# Patient Record
Sex: Female | Born: 1960 | Race: White | Hispanic: No | Smoking: Never smoker
Health system: Southern US, Community
[De-identification: ages and names within clinical notes are randomized; demographics above are authoritative.]

## PROBLEM LIST (undated history)

## (undated) DIAGNOSIS — R519 Headache, unspecified: Secondary | ICD-10-CM

## (undated) DIAGNOSIS — Z8719 Personal history of other diseases of the digestive system: Secondary | ICD-10-CM

## (undated) DIAGNOSIS — F419 Anxiety disorder, unspecified: Secondary | ICD-10-CM

## (undated) DIAGNOSIS — M545 Low back pain, unspecified: Secondary | ICD-10-CM

## (undated) DIAGNOSIS — I10 Essential (primary) hypertension: Secondary | ICD-10-CM

## (undated) DIAGNOSIS — E785 Hyperlipidemia, unspecified: Secondary | ICD-10-CM

## (undated) DIAGNOSIS — R51 Headache: Secondary | ICD-10-CM

## (undated) DIAGNOSIS — Z9889 Other specified postprocedural states: Secondary | ICD-10-CM

## (undated) DIAGNOSIS — M1990 Unspecified osteoarthritis, unspecified site: Secondary | ICD-10-CM

## (undated) DIAGNOSIS — M25551 Pain in right hip: Secondary | ICD-10-CM

## (undated) DIAGNOSIS — R011 Cardiac murmur, unspecified: Secondary | ICD-10-CM

## (undated) HISTORY — DX: Low back pain, unspecified: M54.50

## (undated) HISTORY — DX: Pain in right hip: M25.551

## (undated) HISTORY — DX: Hyperlipidemia, unspecified: E78.5

## (undated) HISTORY — DX: Essential (primary) hypertension: I10

## (undated) HISTORY — DX: Unspecified osteoarthritis, unspecified site: M19.90

## (undated) HISTORY — DX: Personal history of other diseases of the digestive system: Z87.19

## (undated) HISTORY — DX: Low back pain: M54.5

## (undated) HISTORY — DX: Anxiety disorder, unspecified: F41.9

## (undated) HISTORY — PX: PAIN PUMP IMPLANTATION: SHX330

## (undated) HISTORY — DX: Other specified postprocedural states: Z98.890

## (undated) HISTORY — PX: CYSTECTOMY: SUR359

## (undated) HISTORY — PX: HAND SURGERY: SHX662

## (undated) HISTORY — PX: HEMORRHOID SURGERY: SHX153

---

## 2008-10-12 ENCOUNTER — Emergency Department: Payer: Self-pay | Admitting: Unknown Physician Specialty

## 2008-10-13 ENCOUNTER — Ambulatory Visit: Payer: Self-pay | Admitting: Cardiology

## 2008-12-17 ENCOUNTER — Ambulatory Visit: Payer: Self-pay | Admitting: Pain Medicine

## 2008-12-20 ENCOUNTER — Ambulatory Visit: Payer: Self-pay | Admitting: Pain Medicine

## 2009-01-03 ENCOUNTER — Ambulatory Visit: Payer: Self-pay | Admitting: Physician Assistant

## 2009-10-12 DIAGNOSIS — Z8711 Personal history of peptic ulcer disease: Secondary | ICD-10-CM

## 2009-10-12 HISTORY — DX: Personal history of peptic ulcer disease: Z87.11

## 2010-03-14 ENCOUNTER — Emergency Department (HOSPITAL_COMMUNITY): Admission: EM | Admit: 2010-03-14 | Discharge: 2010-03-15 | Payer: Self-pay | Admitting: Emergency Medicine

## 2010-04-05 ENCOUNTER — Inpatient Hospital Stay (HOSPITAL_COMMUNITY): Admission: EM | Admit: 2010-04-05 | Discharge: 2010-04-09 | Payer: Self-pay | Admitting: Cardiovascular Disease

## 2010-04-05 ENCOUNTER — Encounter (INDEPENDENT_AMBULATORY_CARE_PROVIDER_SITE_OTHER): Payer: Self-pay | Admitting: Cardiovascular Disease

## 2010-04-06 ENCOUNTER — Encounter: Payer: Self-pay | Admitting: Cardiology

## 2010-04-06 LAB — CONVERTED CEMR LAB
Cholesterol: 135 mg/dL
HDL: 56 mg/dL

## 2010-04-07 DIAGNOSIS — Z9889 Other specified postprocedural states: Secondary | ICD-10-CM

## 2010-04-07 HISTORY — DX: Other specified postprocedural states: Z98.890

## 2010-04-08 ENCOUNTER — Encounter: Payer: Self-pay | Admitting: Cardiology

## 2010-04-08 ENCOUNTER — Encounter (INDEPENDENT_AMBULATORY_CARE_PROVIDER_SITE_OTHER): Payer: Self-pay | Admitting: *Deleted

## 2010-04-08 DIAGNOSIS — I209 Angina pectoris, unspecified: Secondary | ICD-10-CM

## 2010-04-08 DIAGNOSIS — R55 Syncope and collapse: Secondary | ICD-10-CM | POA: Insufficient documentation

## 2010-04-08 LAB — CONVERTED CEMR LAB
BUN: 8 mg/dL
Chloride: 110 meq/L
Glucose, Bld: 85 mg/dL
Platelets: 141 10*3/uL
Sodium: 143 meq/L

## 2010-04-09 ENCOUNTER — Encounter (INDEPENDENT_AMBULATORY_CARE_PROVIDER_SITE_OTHER): Payer: Self-pay | Admitting: *Deleted

## 2010-10-08 ENCOUNTER — Observation Stay (HOSPITAL_COMMUNITY): Admission: EM | Admit: 2010-10-08 | Discharge: 2010-10-11 | Payer: Self-pay | Source: Home / Self Care

## 2010-11-11 NOTE — Miscellaneous (Signed)
Summary: hospital labs 6/28/201  Clinical Lists Changes  Observations: Added new observation of CALCIUM: 8.5 mg/dL (16/07/9603 54:09) Added new observation of GFR AA: >60 mL/min/1.66m2 (04/08/2010 14:31) Added new observation of GFR: >60 mL/min (04/08/2010 14:31) Added new observation of CREATININE: 0.63 mg/dL (81/19/1478 29:56) Added new observation of BUN: 8 mg/dL (21/30/8657 84:69) Added new observation of BG RANDOM: 85 mg/dL (62/95/2841 32:44) Added new observation of CO2 PLSM/SER: 24 meq/L (04/08/2010 14:31) Added new observation of CL SERUM: 110 meq/L (04/08/2010 14:31) Added new observation of K SERUM: 3.6 meq/L (04/08/2010 14:31) Added new observation of NA: 143 meq/L (04/08/2010 14:31) Added new observation of PLATELETK/UL: 141 K/uL (04/08/2010 14:31) Added new observation of MCV: 96.4 fL (04/08/2010 14:31) Added new observation of HCT: 33.6 % (04/08/2010 14:31) Added new observation of HGB: 11.6 g/dL (10/14/7251 66:44) Added new observation of WBC COUNT: 4.5 10*3/microliter (04/08/2010 14:31)

## 2010-11-11 NOTE — Letter (Signed)
Summary: Appointment - Missed  [facility] [place]  [address] [place] [address]   Phone: [phone]  Fax: [phone]     April 09, 2010 MRN: [id number]   Audrey Bird [address] [place], [place]  [address]   Dear [name],  Our records indicate you missed your appointment on      04/09/10                  with Dr.       .        ROTHBART                            It is very important that we reach you to reschedule this appointment. We look forward to participating in your health care needs. Please contact us at the number listed above at your earliest convenience to reschedule this appointment.     Sincerely,    Glass blower/designer

## 2010-12-22 LAB — BASIC METABOLIC PANEL
BUN: 8 mg/dL (ref 6–23)
CO2: 23 mEq/L (ref 19–32)
CO2: 26 mEq/L (ref 19–32)
Calcium: 8.4 mg/dL (ref 8.4–10.5)
Chloride: 106 mEq/L (ref 96–112)
Chloride: 110 mEq/L (ref 96–112)
Creatinine, Ser: 0.73 mg/dL (ref 0.4–1.2)
GFR calc Af Amer: >60 mL/min (ref >60)
GFR calc non Af Amer: >60 mL/min (ref >60)
Glucose, Bld: 85 mg/dL (ref 70–99)

## 2010-12-22 LAB — CBC
HCT: 28.9 % — ABNORMAL LOW (ref 36.0–46.0)
Hemoglobin: 11.2 g/dL — ABNORMAL LOW (ref 12.0–15.0)
Hemoglobin: 9.9 g/dL — ABNORMAL LOW (ref 12.0–15.0)
MCH: 29.7 pg (ref 26.0–34.0)
MCHC: 34.3 g/dL (ref 30.0–36.0)
Platelets: 206 10*3/uL (ref 150–400)
RBC: 3.33 MIL/uL — ABNORMAL LOW (ref 3.87–5.11)
RBC: 3.83 MIL/uL — ABNORMAL LOW (ref 3.87–5.11)

## 2010-12-22 LAB — FOLATE RBC: RBC Folate: 603 ng/mL — ABNORMAL HIGH (ref 180–600)

## 2010-12-22 LAB — HEMOGLOBIN A1C: Mean Plasma Glucose: 71 mg/dL (ref <117)

## 2010-12-22 LAB — COMPREHENSIVE METABOLIC PANEL
ALT: 8 U/L (ref 0–35)
Alkaline Phosphatase: 42 U/L (ref 39–117)
CO2: 26 mEq/L (ref 19–32)
Calcium: 8.7 mg/dL (ref 8.4–10.5)
GFR calc non Af Amer: >60 mL/min (ref >60)
Glucose, Bld: 82 mg/dL (ref 70–99)
Sodium: 144 mEq/L (ref 135–145)
Total Bilirubin: 0.2 mg/dL — ABNORMAL LOW (ref 0.3–1.2)

## 2010-12-22 LAB — IRON AND TIBC
Iron: 20 ug/dL — ABNORMAL LOW (ref 42–135)
UIBC: 226 ug/dL

## 2010-12-22 LAB — DIFFERENTIAL
Basophils Absolute: 0 10*3/uL (ref 0.0–0.1)
Basophils Relative: 0 % (ref 0–1)
Eosinophils Absolute: 0.1 10*3/uL (ref 0.0–0.7)
Lymphocytes Relative: 30 % (ref 12–46)
Lymphs Abs: 1.1 10*3/uL (ref 0.7–4.0)
Lymphs Abs: 1.6 10*3/uL (ref 0.7–4.0)
Monocytes Absolute: 0.3 10*3/uL (ref 0.1–1.0)
Monocytes Absolute: 0.3 10*3/uL (ref 0.1–1.0)
Monocytes Relative: 6 % (ref 3–12)
Monocytes Relative: 9 % (ref 3–12)
Neutro Abs: 1.5 10*3/uL — ABNORMAL LOW (ref 1.7–7.7)

## 2010-12-22 LAB — RAPID URINE DRUG SCREEN, HOSP PERFORMED
Amphetamines: NOT DETECTED
Benzodiazepines: NOT DETECTED
Tetrahydrocannabinol: NOT DETECTED

## 2010-12-22 LAB — H. PYLORI ANTIBODY, IGG: H Pylori IgG: <0.4 {ISR}

## 2010-12-22 LAB — CARDIAC PANEL(CRET KIN+CKTOT+MB+TROPI)
Relative Index: INVALID (ref 0.0–2.5)
Troponin I: 0.01 ng/mL (ref 0.00–0.06)
Troponin I: <0.01 ng/mL (ref 0.00–0.06)
Troponin I: <0.01 ng/mL (ref 0.00–0.06)

## 2010-12-22 LAB — POCT CARDIAC MARKERS: Troponin i, poc: <0.05 ng/mL (ref 0.00–0.09)

## 2010-12-22 LAB — MAGNESIUM: Magnesium: 1.9 mg/dL (ref 1.5–2.5)

## 2010-12-22 LAB — TSH: TSH: 4.233 u[IU]/mL (ref 0.350–4.500)

## 2010-12-28 LAB — BASIC METABOLIC PANEL
BUN: 8 mg/dL (ref 6–23)
BUN: 9 mg/dL (ref 6–23)
CO2: 22 mEq/L (ref 19–32)
CO2: 25 mEq/L (ref 19–32)
CO2: 27 mEq/L (ref 19–32)
Calcium: 8.5 mg/dL (ref 8.4–10.5)
Calcium: 8.6 mg/dL (ref 8.4–10.5)
Calcium: 8.6 mg/dL (ref 8.4–10.5)
Calcium: 9.6 mg/dL (ref 8.4–10.5)
Chloride: 107 mEq/L (ref 96–112)
Chloride: 109 mEq/L (ref 96–112)
Chloride: 110 mEq/L (ref 96–112)
Chloride: 115 mEq/L — ABNORMAL HIGH (ref 96–112)
Creatinine, Ser: 0.67 mg/dL (ref 0.4–1.2)
Creatinine, Ser: 0.67 mg/dL (ref 0.4–1.2)
Creatinine, Ser: 0.68 mg/dL (ref 0.4–1.2)
GFR calc Af Amer: >60 mL/min (ref >60)
GFR calc Af Amer: >60 mL/min (ref >60)
GFR calc Af Amer: >60 mL/min (ref >60)
GFR calc Af Amer: >60 mL/min (ref >60)
GFR calc Af Amer: >60 mL/min (ref >60)
GFR calc Af Amer: >60 mL/min (ref >60)
GFR calc non Af Amer: >60 mL/min (ref >60)
GFR calc non Af Amer: >60 mL/min (ref >60)
GFR calc non Af Amer: >60 mL/min (ref >60)
Glucose, Bld: 85 mg/dL (ref 70–99)
Glucose, Bld: 90 mg/dL (ref 70–99)
Glucose, Bld: 96 mg/dL (ref 70–99)
Potassium: 3.2 mEq/L — ABNORMAL LOW (ref 3.5–5.1)
Potassium: 3.4 mEq/L — ABNORMAL LOW (ref 3.5–5.1)
Potassium: 3.5 mEq/L (ref 3.5–5.1)
Potassium: 3.6 mEq/L (ref 3.5–5.1)
Sodium: 138 mEq/L (ref 135–145)
Sodium: 141 mEq/L (ref 135–145)
Sodium: 142 mEq/L (ref 135–145)
Sodium: 143 mEq/L (ref 135–145)

## 2010-12-28 LAB — TSH: TSH: 2.127 u[IU]/mL (ref 0.350–4.500)

## 2010-12-28 LAB — CARDIAC PANEL(CRET KIN+CKTOT+MB+TROPI)
CK, MB: 0.8 ng/mL (ref 0.3–4.0)
CK, MB: 0.9 ng/mL (ref 0.3–4.0)
Relative Index: INVALID (ref 0.0–2.5)
Relative Index: INVALID (ref 0.0–2.5)
Total CK: 51 U/L (ref 7–177)
Troponin I: 0.01 ng/mL (ref 0.00–0.06)
Troponin I: 0.01 ng/mL (ref 0.00–0.06)
Troponin I: 0.01 ng/mL (ref 0.00–0.06)

## 2010-12-28 LAB — CBC
HCT: 33.6 % — ABNORMAL LOW (ref 36.0–46.0)
HCT: 35.5 % — ABNORMAL LOW (ref 36.0–46.0)
HCT: 40.1 % (ref 36.0–46.0)
Hemoglobin: 11.6 g/dL — ABNORMAL LOW (ref 12.0–15.0)
Hemoglobin: 12.5 g/dL (ref 12.0–15.0)
Hemoglobin: 14 g/dL (ref 12.0–15.0)
MCH: 33 pg (ref 26.0–34.0)
MCH: 33.3 pg (ref 26.0–34.0)
MCHC: 34.3 g/dL (ref 30.0–36.0)
MCHC: 34.8 g/dL (ref 30.0–36.0)
MCHC: 35.2 g/dL (ref 30.0–36.0)
MCV: 94.7 fL (ref 78.0–100.0)
Platelets: 149 10*3/uL — ABNORMAL LOW (ref 150–400)
Platelets: 157 10*3/uL (ref 150–400)
RBC: 3.49 MIL/uL — ABNORMAL LOW (ref 3.87–5.11)
RBC: 3.5 MIL/uL — ABNORMAL LOW (ref 3.87–5.11)
RBC: 3.6 MIL/uL — ABNORMAL LOW (ref 3.87–5.11)
RBC: 4.24 MIL/uL (ref 3.87–5.11)
RDW: 12.3 % (ref 11.5–15.5)
RDW: 12.4 % (ref 11.5–15.5)
RDW: 12.6 % (ref 11.5–15.5)
WBC: 6.1 10*3/uL (ref 4.0–10.5)
WBC: 6.9 10*3/uL (ref 4.0–10.5)

## 2010-12-28 LAB — DIFFERENTIAL
Basophils Relative: 1 % (ref 0–1)
Eosinophils Absolute: 0 10*3/uL (ref 0.0–0.7)
Eosinophils Relative: 1 % (ref 0–5)
Lymphs Abs: 2.2 10*3/uL (ref 0.7–4.0)
Monocytes Absolute: 0.3 10*3/uL (ref 0.1–1.0)
Monocytes Relative: 5 % (ref 3–12)
Neutrophils Relative %: 57 % (ref 43–77)

## 2010-12-28 LAB — SAMPLE TO BLOOD BANK

## 2010-12-28 LAB — HEPARIN LEVEL (UNFRACTIONATED)
Heparin Unfractionated: 0.41 IU/mL (ref 0.30–0.70)
Heparin Unfractionated: 0.64 IU/mL (ref 0.30–0.70)

## 2010-12-28 LAB — LIPID PANEL
Triglycerides: 51 mg/dL (ref <150)
VLDL: 10 mg/dL (ref 0–40)

## 2010-12-28 LAB — POCT I-STAT, CHEM 8
BUN: 11 mg/dL (ref 6–23)
Chloride: 109 mEq/L (ref 96–112)
Creatinine, Ser: 0.8 mg/dL (ref 0.4–1.2)
Sodium: 144 mEq/L (ref 135–145)
TCO2: 26 mmol/L (ref 0–100)

## 2010-12-28 LAB — POCT CARDIAC MARKERS
CKMB, poc: <1 ng/mL — ABNORMAL LOW (ref 1.0–8.0)
CKMB, poc: <1 ng/mL — ABNORMAL LOW (ref 1.0–8.0)
Myoglobin, poc: 32.1 ng/mL (ref 12–200)
Myoglobin, poc: 39.5 ng/mL (ref 12–200)
Troponin i, poc: <0.05 ng/mL (ref 0.00–0.09)

## 2010-12-28 LAB — PROTIME-INR: Prothrombin Time: 12.9 seconds (ref 11.6–15.2)

## 2010-12-29 LAB — CBC
HCT: 36.2 % (ref 36.0–46.0)
Hemoglobin: 12.6 g/dL (ref 12.0–15.0)
MCHC: 34.8 g/dL (ref 30.0–36.0)
MCV: 94.3 fL (ref 78.0–100.0)
RDW: 12.7 % (ref 11.5–15.5)

## 2010-12-29 LAB — DIFFERENTIAL
Basophils Absolute: 0 10*3/uL (ref 0.0–0.1)
Eosinophils Absolute: 0.1 10*3/uL (ref 0.0–0.7)
Eosinophils Relative: 1 % (ref 0–5)
Lymphocytes Relative: 42 % (ref 12–46)
Monocytes Absolute: 0.4 10*3/uL (ref 0.1–1.0)

## 2010-12-29 LAB — BASIC METABOLIC PANEL
BUN: 18 mg/dL (ref 6–23)
CO2: 25 mEq/L (ref 19–32)
Chloride: 110 mEq/L (ref 96–112)
GFR calc non Af Amer: >60 mL/min (ref >60)
Glucose, Bld: 95 mg/dL (ref 70–99)
Potassium: 3.1 mEq/L — ABNORMAL LOW (ref 3.5–5.1)
Sodium: 140 mEq/L (ref 135–145)

## 2010-12-29 LAB — POCT CARDIAC MARKERS
CKMB, poc: <1 ng/mL — ABNORMAL LOW (ref 1.0–8.0)
Troponin i, poc: <0.05 ng/mL (ref 0.00–0.09)
Troponin i, poc: <0.05 ng/mL (ref 0.00–0.09)

## 2011-01-28 ENCOUNTER — Emergency Department (HOSPITAL_COMMUNITY): Payer: Medicare Other

## 2011-01-28 ENCOUNTER — Observation Stay (HOSPITAL_COMMUNITY)
Admission: EM | Admit: 2011-01-28 | Discharge: 2011-01-29 | Disposition: A | Discharge status: Home / Self Care | Payer: Medicare Other | Attending: Cardiovascular Disease | Admitting: Cardiovascular Disease

## 2011-01-28 DIAGNOSIS — R51 Headache: Secondary | ICD-10-CM | POA: Insufficient documentation

## 2011-01-28 DIAGNOSIS — M25559 Pain in unspecified hip: Secondary | ICD-10-CM | POA: Insufficient documentation

## 2011-01-28 DIAGNOSIS — F411 Generalized anxiety disorder: Secondary | ICD-10-CM | POA: Insufficient documentation

## 2011-01-28 DIAGNOSIS — F17200 Nicotine dependence, unspecified, uncomplicated: Secondary | ICD-10-CM | POA: Insufficient documentation

## 2011-01-28 DIAGNOSIS — R079 Chest pain, unspecified: Principal | ICD-10-CM | POA: Insufficient documentation

## 2011-01-28 DIAGNOSIS — K208 Other esophagitis without bleeding: Secondary | ICD-10-CM | POA: Insufficient documentation

## 2011-01-28 LAB — BASIC METABOLIC PANEL
GFR calc non Af Amer: >60 mL/min (ref >60)
Potassium: 3.3 mEq/L — ABNORMAL LOW (ref 3.5–5.1)
Sodium: 136 mEq/L (ref 135–145)

## 2011-01-28 LAB — DIFFERENTIAL
Basophils Absolute: 0 10*3/uL (ref 0.0–0.1)
Eosinophils Absolute: 0 10*3/uL (ref 0.0–0.7)
Eosinophils Relative: 0 % (ref 0–5)
Lymphocytes Relative: 29 % (ref 12–46)
Neutrophils Relative %: 65 % (ref 43–77)

## 2011-01-28 LAB — CK TOTAL AND CKMB (NOT AT ARMC)
CK, MB: 1.3 ng/mL (ref 0.3–4.0)
Relative Index: INVALID (ref 0.0–2.5)
Total CK: 47 U/L (ref 7–177)

## 2011-01-28 LAB — CBC
Platelets: 175 10*3/uL (ref 150–400)
RDW: 14.7 % (ref 11.5–15.5)
WBC: 6.3 10*3/uL (ref 4.0–10.5)

## 2011-01-28 LAB — TROPONIN I: Troponin I: 0.02 ng/mL (ref 0.00–0.06)

## 2011-01-29 ENCOUNTER — Inpatient Hospital Stay (HOSPITAL_COMMUNITY): Payer: Medicare Other

## 2011-01-29 ENCOUNTER — Other Ambulatory Visit (HOSPITAL_COMMUNITY): Payer: Medicare Other

## 2011-01-29 LAB — CBC
Hemoglobin: 12.9 g/dL (ref 12.0–15.0)
Platelets: 160 10*3/uL (ref 150–400)
RBC: 4.43 MIL/uL (ref 3.87–5.11)
WBC: 3.6 10*3/uL — ABNORMAL LOW (ref 4.0–10.5)

## 2011-01-29 LAB — CARDIAC PANEL(CRET KIN+CKTOT+MB+TROPI)
Relative Index: INVALID (ref 0.0–2.5)
Troponin I: 0.01 ng/mL (ref 0.00–0.06)

## 2011-01-29 LAB — LIPID PANEL
Cholesterol: 155 mg/dL (ref 0–200)
Total CHOL/HDL Ratio: 2.8 RATIO
VLDL: 12 mg/dL (ref 0–40)

## 2011-01-29 LAB — COMPREHENSIVE METABOLIC PANEL
Alkaline Phosphatase: 30 U/L — ABNORMAL LOW (ref 39–117)
BUN: 10 mg/dL (ref 6–23)
CO2: 26 mEq/L (ref 19–32)
GFR calc non Af Amer: >60 mL/min (ref >60)
Glucose, Bld: 88 mg/dL (ref 70–99)
Potassium: 3.6 mEq/L (ref 3.5–5.1)
Total Bilirubin: 0.4 mg/dL (ref 0.3–1.2)
Total Protein: 5.7 g/dL — ABNORMAL LOW (ref 6.0–8.3)

## 2011-01-29 LAB — TSH: TSH: 3.285 u[IU]/mL (ref 0.350–4.500)

## 2011-01-29 LAB — HEPARIN LEVEL (UNFRACTIONATED): Heparin Unfractionated: 0.33 IU/mL (ref 0.30–0.70)

## 2011-01-29 MED ORDER — TECHNETIUM TC 99M TETROFOSMIN IV KIT
10.0000 | PACK | Freq: Once | INTRAVENOUS | Status: AC | PRN
  Administered 2011-01-29: 10 via INTRAVENOUS

## 2011-01-30 ENCOUNTER — Other Ambulatory Visit (HOSPITAL_COMMUNITY): Payer: Medicare Other

## 2011-02-03 NOTE — Discharge Summary (Signed)
Audrey Bird, Audrey Bird               ACCOUNT NO.:  [id number]  MEDICAL RECORD NO.:  [id number]           PATIENT TYPE:  I  LOCATION:  2027                         FACILITY:  [facility]  PHYSICIAN:  Ricki Rodriguez, M.D.  DATE OF BIRTH:  1960-11-07  DATE OF ADMISSION:  01/28/2011 DATE OF DISCHARGE:  01/29/2011                              DISCHARGE SUMMARY   FINAL DIAGNOSES: 1. Chest pain. 2. Right hip pain. 3. Anxiety. 4. Reflux esophagitis. 5. Chronic headache.  DISCHARGE MEDICATIONS: 1. Aspirin 81 mg one daily. 2. Lisinopril 2.5 mg daily. 3. Phenergan 12.5 mg twice daily as needed for nausea. 4. Potassium 10 mEq one daily. 5. Simvastatin 10 mg one at bedtime. 6. Amlodipine 2.5 mg one daily. 7. Fish oil 1 g daily. 8. Hydrocodone/APAP 5/500 mg one four times daily as needed. 9. Iron over-the-counter one daily. 10.Multivitamin daily. 11.Nitroglycerin sublingual 0.4 mg tablet one under the tongue every 5     minutes x3 as needed for chest pain. 12.Protonix 40 mg enteric-coated one daily.  DISCHARGE DIET:  Low-sodium heart-healthy diet.  DISCHARGE ACTIVITY:  The patient to increase activity slowly as tolerated.  FOLLOWUP:  By Dr. Orpah Cobb in 1 month and by primary care physician Dr. Modesto Charon roughly in the [place] area in 1 week.  HISTORY:  This 50 year old white female with a known history of coronary artery disease and coronary vasospasm, had recurrent substernal chest pain according to the patient and husband.  The patient gets this pain on leaving her right hip pain becomes unbearable and she gets into nausea, vomiting and then comes down with chest pain.  PHYSICAL EXAMINATION:  VITAL SIGNS:  Temperature 98, pulse 70, respirations 16 and blood pressure 110/60. GENERAL:  The patient is averagely built and well nourished. HEENT:  The patient is normocephalic and atraumatic with blue eyes. Conjunctivae pink.  Sclerae nonicteric.  Pupils equally reactive to light.   Extraocular movement intact.  Wears reading glasses. NECK:  No JVD, no carotid bruit. LUNGS:  Clear bilaterally. HEART:  Normal S1 and S2. ABDOMEN:  Soft and nontender. EXTREMITIES:  No edema, cyanosis or clubbing. SKIN:  Warm and dry. NEUROLOGICALLY:  The patient moves all four extremities.  LABORATORY DATA:  Normal hemoglobin/hematocrit, WBC count, platelet count.  Near normal electrolytes, BUN, creatinine and glucose. Potassium low at 3.4.  CK-MB, troponin I normal x3.    EKG revealed sinus rhythm with anterolateral T-wave changes.  HOSPITAL COURSE:  The patient was placed in observation.  She was ruled out for myocardial infarction.  She was scheduled for Persantine Myoview stress test.  However, the patient and the husband declined the test. She had stopped most of her medications, so new prescriptions were given for her medications and she was also advised to return to the emergency room as needed.  The patient will have followup by primary care physician Dr. Modesto Charon in [place] area in 4-5 days and by me in the one month or as needed.     Ricki Rodriguez, M.D.     ASK/MEDQ  D:  01/29/2011  T:  01/30/2011  Job:  [id number]  Electronically  Signed by Orpah Cobb M.D. on 02/03/2011 02:19:26 PM

## 2011-04-02 ENCOUNTER — Encounter: Payer: Self-pay | Admitting: Family Medicine

## 2011-04-02 DIAGNOSIS — M25551 Pain in right hip: Secondary | ICD-10-CM | POA: Insufficient documentation

## 2011-10-26 ENCOUNTER — Emergency Department (HOSPITAL_COMMUNITY)
Admission: EM | Admit: 2011-10-26 | Discharge: 2011-10-27 | Disposition: A | Discharge status: Home / Self Care | Payer: Medicare Other | Attending: Emergency Medicine | Admitting: Emergency Medicine

## 2011-10-26 ENCOUNTER — Encounter (HOSPITAL_COMMUNITY): Payer: Self-pay | Admitting: *Deleted

## 2011-10-26 DIAGNOSIS — R51 Headache: Secondary | ICD-10-CM | POA: Diagnosis not present

## 2011-10-26 DIAGNOSIS — R6889 Other general symptoms and signs: Secondary | ICD-10-CM | POA: Diagnosis not present

## 2011-10-26 DIAGNOSIS — E785 Hyperlipidemia, unspecified: Secondary | ICD-10-CM | POA: Diagnosis not present

## 2011-10-26 DIAGNOSIS — R6883 Chills (without fever): Secondary | ICD-10-CM | POA: Insufficient documentation

## 2011-10-26 DIAGNOSIS — R5383 Other fatigue: Secondary | ICD-10-CM | POA: Insufficient documentation

## 2011-10-26 DIAGNOSIS — J3489 Other specified disorders of nose and nasal sinuses: Secondary | ICD-10-CM | POA: Diagnosis not present

## 2011-10-26 DIAGNOSIS — R5381 Other malaise: Secondary | ICD-10-CM | POA: Diagnosis not present

## 2011-10-26 DIAGNOSIS — M549 Dorsalgia, unspecified: Secondary | ICD-10-CM | POA: Insufficient documentation

## 2011-10-26 DIAGNOSIS — R112 Nausea with vomiting, unspecified: Secondary | ICD-10-CM | POA: Insufficient documentation

## 2011-10-26 DIAGNOSIS — K5289 Other specified noninfective gastroenteritis and colitis: Secondary | ICD-10-CM | POA: Diagnosis not present

## 2011-10-26 DIAGNOSIS — I10 Essential (primary) hypertension: Secondary | ICD-10-CM | POA: Diagnosis not present

## 2011-10-26 DIAGNOSIS — R109 Unspecified abdominal pain: Secondary | ICD-10-CM | POA: Insufficient documentation

## 2011-10-26 DIAGNOSIS — IMO0001 Reserved for inherently not codable concepts without codable children: Secondary | ICD-10-CM | POA: Diagnosis not present

## 2011-10-26 DIAGNOSIS — K529 Noninfective gastroenteritis and colitis, unspecified: Secondary | ICD-10-CM

## 2011-10-26 MED ORDER — ONDANSETRON HCL 4 MG/2ML IJ SOLN
4.0000 mg | Freq: Once | INTRAMUSCULAR | Status: AC
Start: 2011-10-26 — End: 2011-10-26
  Administered 2011-10-26: 4 mg via INTRAVENOUS

## 2011-10-26 MED ORDER — ACETAMINOPHEN 500 MG PO TABS
1000.0000 mg | ORAL_TABLET | Freq: Once | ORAL | Status: AC
  Administered 2011-10-26: 1000 mg via ORAL
  Filled 2011-10-26: qty 2

## 2011-10-26 MED ORDER — PSEUDOEPHEDRINE HCL 60 MG PO TABS
60.0000 mg | ORAL_TABLET | Freq: Once | ORAL | Status: AC
  Administered 2011-10-26: 60 mg via ORAL
  Filled 2011-10-26: qty 1

## 2011-10-26 MED ORDER — IBUPROFEN 800 MG PO TABS
800.0000 mg | ORAL_TABLET | Freq: Once | ORAL | Status: AC
  Administered 2011-10-26: 800 mg via ORAL
  Filled 2011-10-26: qty 1

## 2011-10-26 MED ORDER — ONDANSETRON HCL 4 MG/2ML IJ SOLN
INTRAMUSCULAR | Status: AC
  Administered 2011-10-26: 4 mg via INTRAVENOUS
  Filled 2011-10-26: qty 2

## 2011-10-26 NOTE — ED Provider Notes (Signed)
History     CSN: [id number]  Arrival date & time 10/26/11  2035   None     Chief Complaint  Patient presents with  . Emesis    (Consider location/radiation/quality/duration/timing/severity/associated sxs/prior treatment) HPI Comments: Patient reports 4 days of generally not feeling well. In the last 2 days she's been having nausea and vomiting. She is also developed nasal congestion and is having a complaint of a headache. It is of note that her husband has flu-type symptoms. And she presents now for assistance with these symptoms. She has not had hemoptysis or hematemesis. She reports chills but is unsure of temperature elevation.  Patient is a 51 y.o. female presenting with vomiting. The history is provided by the patient.  Emesis  Associated symptoms include chills, headaches and myalgias. Pertinent negatives include no abdominal pain, no arthralgias and no cough.    Past Medical History  Diagnosis Date  . Hypertension   . Hyperlipidemia   . Anxiety   . Hip pain, right     chronic  . Fracture of pelvis     history of  . Lumbago     history of    Past Surgical History  Procedure Date  . Hemorrhoid surgery     Family History  Problem Relation Age of Onset  . Cancer Father     skin  . Aneurysm Father     brain    History  Substance Use Topics  . Smoking status: Never Smoker   . Smokeless tobacco: Not on file  . Alcohol Use: No    OB History    Grav Para Term Preterm Abortions TAB SAB Ect Mult Living                  Review of Systems  Constitutional: Positive for chills, appetite change and fatigue. Negative for activity change.       All ROS Neg except as noted in HPI  HENT: Positive for congestion and postnasal drip. Negative for nosebleeds and neck pain.   Eyes: Negative for photophobia and discharge.  Respiratory: Negative for cough, shortness of breath and wheezing.   Cardiovascular: Negative for chest pain and palpitations.  Gastrointestinal:  Positive for vomiting. Negative for abdominal pain and blood in stool.  Genitourinary: Negative for dysuria, frequency and hematuria.  Musculoskeletal: Positive for myalgias and back pain. Negative for arthralgias.  Skin: Negative.   Neurological: Positive for headaches. Negative for dizziness, seizures and speech difficulty.  Psychiatric/Behavioral: Negative for hallucinations and confusion.    Allergies  Review of patient's allergies indicates no known allergies.  Home Medications   Current Outpatient Rx  Name Route Sig Dispense Refill  . ACETAMINOPHEN 500 MG PO TABS Oral Take 1,000 mg by mouth 2 (two) times daily as needed. For pain    . HYDROCODONE-ACETAMINOPHEN 7.5-500 MG PO TABS Oral Take 1 tablet by mouth every 6 (six) hours as needed. For pain    . ADULT MULTIVITAMIN W/MINERALS CH Oral Take 1 tablet by mouth daily.    . NYQUIL PO Oral Take 2 capsules by mouth as needed. For cold symptoms      BP 171/112  Pulse 81  Temp(Src) 97.4 F (36.3 C) (Oral)  Resp 20  Ht 5\' 6"  (1.676 m)  Wt 144 lb (65.318 kg)  BMI 23.24 kg/m2  SpO2 99%  Physical Exam  Nursing note and vitals reviewed. Constitutional: She is oriented to person, place, and time. She appears well-developed and well-nourished.  Non-toxic appearance.  HENT:  Head: Normocephalic.  Right Ear: Tympanic membrane and external ear normal.  Left Ear: Tympanic membrane and external ear normal.       Nasal congestion present  Eyes: EOM and lids are normal. Pupils are equal, round, and reactive to light.  Neck: Normal range of motion. Neck supple. Carotid bruit is not present.  Cardiovascular: Normal rate, regular rhythm, normal heart sounds, intact distal pulses and normal pulses.   Pulmonary/Chest: Breath sounds normal. No respiratory distress.  Abdominal: Soft. Bowel sounds are normal. There is no guarding.       Abdomen sore, but no rebound tenderness and no guarding.  Musculoskeletal: Normal range of motion.    Lymphadenopathy:       Head (right side): No submandibular adenopathy present.       Head (left side): No submandibular adenopathy present.    She has no cervical adenopathy.  Neurological: She is alert and oriented to person, place, and time. She has normal strength. No cranial nerve deficit or sensory deficit.  Skin: Skin is warm and dry.  Psychiatric: She has a normal mood and affect. Her speech is normal.    ED Course  Procedures (including critical care time) Pulse oximetry 99% on room air. Within normal limits by my interpretation. Labs Reviewed - No data to display No results found.   Dx: Gastroenteritis   MDM  I have reviewed nursing notes, vital signs, and all appropriate lab and imaging results for this patient. 11:05 PM no vomiting since IV fluids and IV Zofran. Patient trying fluid challenge at this point. Body aches improving after Tylenol and ibuprofen.  Prescription for Zofran given. Patient to increase fluids. Patient to return with any changes or problems.     Kathie Dike, [place] 10/27/11 0020

## 2011-10-26 NOTE — ED Notes (Signed)
Nausea and vomiting 

## 2011-10-27 MED ORDER — ONDANSETRON HCL 4 MG PO TABS: 4.0000 mg | ORAL_TABLET | Freq: Four times a day (QID) | ORAL | Status: AC

## 2011-10-29 NOTE — ED Provider Notes (Signed)
Medical screening examination/treatment/procedure(s) were performed by non-physician practitioner and as supervising physician I was immediately available for consultation/collaboration.  Donnetta Hutching, MD 10/29/11 1451

## 2011-11-24 DIAGNOSIS — M533 Sacrococcygeal disorders, not elsewhere classified: Secondary | ICD-10-CM | POA: Diagnosis not present

## 2011-11-24 DIAGNOSIS — Z79899 Other long term (current) drug therapy: Secondary | ICD-10-CM | POA: Diagnosis not present

## 2011-11-24 DIAGNOSIS — Z5181 Encounter for therapeutic drug level monitoring: Secondary | ICD-10-CM | POA: Diagnosis not present

## 2011-11-24 DIAGNOSIS — N949 Unspecified condition associated with female genital organs and menstrual cycle: Secondary | ICD-10-CM | POA: Diagnosis not present

## 2011-11-24 DIAGNOSIS — M545 Low back pain: Secondary | ICD-10-CM | POA: Diagnosis not present

## 2011-12-17 DIAGNOSIS — R55 Syncope and collapse: Secondary | ICD-10-CM | POA: Diagnosis not present

## 2011-12-17 DIAGNOSIS — I10 Essential (primary) hypertension: Secondary | ICD-10-CM | POA: Diagnosis not present

## 2011-12-17 DIAGNOSIS — J309 Allergic rhinitis, unspecified: Secondary | ICD-10-CM | POA: Diagnosis not present

## 2011-12-17 DIAGNOSIS — I499 Cardiac arrhythmia, unspecified: Secondary | ICD-10-CM | POA: Diagnosis not present

## 2011-12-23 DIAGNOSIS — M533 Sacrococcygeal disorders, not elsewhere classified: Secondary | ICD-10-CM | POA: Diagnosis not present

## 2012-01-20 DIAGNOSIS — M545 Low back pain: Secondary | ICD-10-CM | POA: Diagnosis not present

## 2012-01-20 DIAGNOSIS — M47817 Spondylosis without myelopathy or radiculopathy, lumbosacral region: Secondary | ICD-10-CM | POA: Diagnosis not present

## 2012-01-22 ENCOUNTER — Encounter (HOSPITAL_COMMUNITY): Payer: Self-pay | Admitting: Emergency Medicine

## 2012-01-22 ENCOUNTER — Emergency Department (HOSPITAL_COMMUNITY)
Admission: EM | Admit: 2012-01-22 | Discharge: 2012-01-22 | Discharge status: Against Medical Advice | Payer: Medicare Other

## 2012-01-22 NOTE — ED Notes (Signed)
Pt left without signing ama paperwork.

## 2012-01-22 NOTE — ED Notes (Signed)
Pt states she had a syncopal episode last night at 2230 and this am 1430. Pt states her fingers are tingling and she has been vomiting since last night.

## 2012-01-26 ENCOUNTER — Ambulatory Visit: Payer: Medicare Other | Admitting: Cardiology

## 2012-02-03 DIAGNOSIS — G8929 Other chronic pain: Secondary | ICD-10-CM | POA: Diagnosis not present

## 2012-02-03 DIAGNOSIS — I10 Essential (primary) hypertension: Secondary | ICD-10-CM | POA: Diagnosis not present

## 2012-02-03 DIAGNOSIS — M1990 Unspecified osteoarthritis, unspecified site: Secondary | ICD-10-CM | POA: Diagnosis not present

## 2012-02-03 DIAGNOSIS — M533 Sacrococcygeal disorders, not elsewhere classified: Secondary | ICD-10-CM | POA: Diagnosis not present

## 2012-02-03 DIAGNOSIS — N949 Unspecified condition associated with female genital organs and menstrual cycle: Secondary | ICD-10-CM | POA: Diagnosis not present

## 2012-02-03 DIAGNOSIS — M461 Sacroiliitis, not elsewhere classified: Secondary | ICD-10-CM | POA: Diagnosis not present

## 2012-02-03 DIAGNOSIS — Z79899 Other long term (current) drug therapy: Secondary | ICD-10-CM | POA: Diagnosis not present

## 2012-02-03 DIAGNOSIS — M545 Low back pain: Secondary | ICD-10-CM | POA: Diagnosis not present

## 2012-02-03 DIAGNOSIS — M47817 Spondylosis without myelopathy or radiculopathy, lumbosacral region: Secondary | ICD-10-CM | POA: Diagnosis not present

## 2012-03-10 DIAGNOSIS — M533 Sacrococcygeal disorders, not elsewhere classified: Secondary | ICD-10-CM | POA: Diagnosis not present

## 2012-03-10 DIAGNOSIS — G5790 Unspecified mononeuropathy of unspecified lower limb: Secondary | ICD-10-CM | POA: Diagnosis not present

## 2012-03-10 DIAGNOSIS — M545 Low back pain: Secondary | ICD-10-CM | POA: Diagnosis not present

## 2012-03-17 DIAGNOSIS — M25559 Pain in unspecified hip: Secondary | ICD-10-CM | POA: Diagnosis not present

## 2012-03-17 DIAGNOSIS — S0990XA Unspecified injury of head, initial encounter: Secondary | ICD-10-CM | POA: Diagnosis not present

## 2012-03-17 DIAGNOSIS — I10 Essential (primary) hypertension: Secondary | ICD-10-CM | POA: Diagnosis not present

## 2012-03-17 DIAGNOSIS — R404 Transient alteration of awareness: Secondary | ICD-10-CM | POA: Diagnosis not present

## 2012-03-17 DIAGNOSIS — S139XXA Sprain of joints and ligaments of unspecified parts of neck, initial encounter: Secondary | ICD-10-CM | POA: Diagnosis not present

## 2012-03-17 DIAGNOSIS — S0993XA Unspecified injury of face, initial encounter: Secondary | ICD-10-CM | POA: Diagnosis not present

## 2012-03-17 DIAGNOSIS — Z79899 Other long term (current) drug therapy: Secondary | ICD-10-CM | POA: Diagnosis not present

## 2012-03-17 DIAGNOSIS — G8929 Other chronic pain: Secondary | ICD-10-CM | POA: Diagnosis not present

## 2012-03-17 DIAGNOSIS — R55 Syncope and collapse: Secondary | ICD-10-CM | POA: Diagnosis not present

## 2012-03-17 DIAGNOSIS — S199XXA Unspecified injury of neck, initial encounter: Secondary | ICD-10-CM | POA: Diagnosis not present

## 2012-03-17 DIAGNOSIS — IMO0002 Reserved for concepts with insufficient information to code with codable children: Secondary | ICD-10-CM | POA: Diagnosis not present

## 2012-04-06 ENCOUNTER — Encounter: Payer: Self-pay | Admitting: Family Medicine

## 2012-04-06 ENCOUNTER — Ambulatory Visit (INDEPENDENT_AMBULATORY_CARE_PROVIDER_SITE_OTHER): Payer: Medicare Other | Admitting: Family Medicine

## 2012-04-06 VITALS — BP 150/96 | HR 86 | Resp 16 | Ht 65.5 in | Wt 127.4 lb

## 2012-04-06 DIAGNOSIS — I10 Essential (primary) hypertension: Secondary | ICD-10-CM

## 2012-04-06 DIAGNOSIS — T490X5A Adverse effect of local antifungal, anti-infective and anti-inflammatory drugs, initial encounter: Secondary | ICD-10-CM

## 2012-04-06 DIAGNOSIS — K259 Gastric ulcer, unspecified as acute or chronic, without hemorrhage or perforation: Secondary | ICD-10-CM | POA: Insufficient documentation

## 2012-04-06 DIAGNOSIS — M25559 Pain in unspecified hip: Secondary | ICD-10-CM

## 2012-04-06 DIAGNOSIS — R55 Syncope and collapse: Secondary | ICD-10-CM | POA: Diagnosis not present

## 2012-04-06 DIAGNOSIS — R110 Nausea: Secondary | ICD-10-CM

## 2012-04-06 DIAGNOSIS — M25551 Pain in right hip: Secondary | ICD-10-CM

## 2012-04-06 DIAGNOSIS — K5900 Constipation, unspecified: Secondary | ICD-10-CM

## 2012-04-06 DIAGNOSIS — Z1211 Encounter for screening for malignant neoplasm of colon: Secondary | ICD-10-CM

## 2012-04-06 MED ORDER — PROMETHAZINE HCL 25 MG RE SUPP: 25.0000 mg | Freq: Four times a day (QID) | RECTAL | Status: DC | PRN

## 2012-04-06 NOTE — Assessment & Plan Note (Signed)
Increase ACEI to 20mg , avoiding rate controlling meds, with ? Arrythmia history /bradycardia in past

## 2012-04-06 NOTE — Assessment & Plan Note (Signed)
Trial of phenergan suppository, has tried zofran and oral phenergan

## 2012-04-06 NOTE — Assessment & Plan Note (Signed)
Chronic hip pain, obtain records from pain physician

## 2012-04-06 NOTE — Assessment & Plan Note (Signed)
She uses laxativies but feels like stool gets "stuck in a pouch" she is on very high doses of narcotics, will send to GI for colonoscopy and evaluation

## 2012-04-06 NOTE — Assessment & Plan Note (Signed)
No meds currently

## 2012-04-06 NOTE — Patient Instructions (Signed)
For your blood pressure increase lisinopril to 20mg  daily Try phenergan suppositories for the nausea I will get the records and labs  I will make a referral for a neurologist- after I get the records  I recommend that you do not drive for now  I will refer your colonoscopy with Dr. Karilyn Cota  F/U in 4 weeks for your blood pressure
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
## 2012-04-06 NOTE — Assessment & Plan Note (Signed)
She has recurred episodes of syncope. She's had workup in the hospital for both cardiac and neuro probe. She's had negative EEG and MRI 2011. She also had a cardiac catheterization which showed very mild three-vessel disease and coronary vasospasm. Exudate CNA where she had a bradycardic episode prior to syncopal event in 2011. I will obtain records from her primary doctor she recently had a 24-hour Holter she likely needs a thick monitor of placed by cardiology. I'm not sure how neurology will help with having normal workup not too long ago however we see what her previous primary doctor  was concerned about

## 2012-04-21 DIAGNOSIS — R109 Unspecified abdominal pain: Secondary | ICD-10-CM | POA: Diagnosis not present

## 2012-04-21 DIAGNOSIS — M545 Low back pain: Secondary | ICD-10-CM | POA: Diagnosis not present

## 2012-04-21 DIAGNOSIS — M533 Sacrococcygeal disorders, not elsewhere classified: Secondary | ICD-10-CM | POA: Diagnosis not present

## 2012-04-27 ENCOUNTER — Ambulatory Visit (INDEPENDENT_AMBULATORY_CARE_PROVIDER_SITE_OTHER): Payer: Medicare Other | Admitting: Internal Medicine

## 2012-05-04 ENCOUNTER — Encounter: Payer: Self-pay | Admitting: Family Medicine

## 2012-05-04 ENCOUNTER — Ambulatory Visit: Payer: Medicare Other

## 2012-05-04 ENCOUNTER — Ambulatory Visit (INDEPENDENT_AMBULATORY_CARE_PROVIDER_SITE_OTHER): Payer: Medicare Other | Admitting: Family Medicine

## 2012-05-04 VITALS — BP 150/100 | HR 75 | Resp 16 | Ht 65.5 in | Wt 130.1 lb

## 2012-05-04 DIAGNOSIS — I10 Essential (primary) hypertension: Secondary | ICD-10-CM

## 2012-05-04 DIAGNOSIS — R110 Nausea: Secondary | ICD-10-CM | POA: Diagnosis not present

## 2012-05-04 MED ORDER — LISINOPRIL 20 MG PO TABS: 20.0000 mg | ORAL_TABLET | Freq: Every day | ORAL | Status: DC

## 2012-05-04 MED ORDER — AMLODIPINE BESYLATE 10 MG PO TABS: 10.0000 mg | ORAL_TABLET | Freq: Every day | ORAL | Status: DC

## 2012-05-04 NOTE — Assessment & Plan Note (Signed)
This tends to improve she's not in severe pain. The Phenergan suppositories have helped significantly. She will reschedule her appointment gastroenterology

## 2012-05-04 NOTE — Patient Instructions (Signed)
Start new blood pressure medication amlodipine  Continue lisinopril at 20mg   Get the lab drawn today  Reschedule your GI appt  F/U with neurology in [facility]  F/U 4 weeks for blood pressure with Dr.Hasty

## 2012-05-04 NOTE — Assessment & Plan Note (Signed)
Her blood pressure is still quite elevated. His disconnection between her pain perception in her blood pressure. I will add amlodipine she's had significant cardiac workup in some history of bradycardia therefore I will avoid any rate blocking medications at this time. We may need the assistance of cardiology. She's not had any syncopal episodes since she was last seen

## 2012-05-04 NOTE — Progress Notes (Signed)
  Subjective:    Patient ID: Audrey Bird, female    DOB: 01/19/1961, 51 y.o.   MRN: [id number]  HPI Patient here to follow blood pressure. Lisinopril was increased to 20 mg. She states her blood pressure yesterday was 164/102 and has been running this high. She's been more pain because her oral medications were cut back by the pain clinic as they were supposed to add lidoderm patches however this was not approved by her insurance company. She now has a new topical anesthetic spray. She is due to see the neurologist in [place] first week of August. She had to reschedule her appointment with gastroenterology as her husband had a MI.    Review of Systems  GEN- denies fatigue, fever, weight loss,weakness, recent illness HEENT- denies eye drainage, change in vision, nasal discharge, CVS- denies chest pain, palpitations RESP- denies SOB, cough, wheeze ABD- denies N/V, change in stools, abd pain GU- denies dysuria, hematuria, dribbling, incontinence MSK- + joint pain,+ muscle aches, injury Neuro- denies headache, dizziness, syncope, seizure activity      Objective:   Physical Exam GEN- NAD, alert and oriented x3, thin female BP recheck 160/100 CVS- RRR, no murmur RESP-CTAB EXT- No edema Pulses- Radial 2+         Assessment & Plan:

## 2012-05-19 ENCOUNTER — Ambulatory Visit: Payer: Medicare Other | Admitting: Family Medicine

## 2012-05-25 ENCOUNTER — Ambulatory Visit (INDEPENDENT_AMBULATORY_CARE_PROVIDER_SITE_OTHER): Payer: Medicare Other | Admitting: Internal Medicine

## 2012-05-25 ENCOUNTER — Other Ambulatory Visit (INDEPENDENT_AMBULATORY_CARE_PROVIDER_SITE_OTHER): Payer: Self-pay | Admitting: *Deleted

## 2012-05-25 ENCOUNTER — Telehealth (INDEPENDENT_AMBULATORY_CARE_PROVIDER_SITE_OTHER): Payer: Self-pay | Admitting: *Deleted

## 2012-05-25 ENCOUNTER — Encounter (INDEPENDENT_AMBULATORY_CARE_PROVIDER_SITE_OTHER): Payer: Self-pay | Admitting: Internal Medicine

## 2012-05-25 VITALS — BP 128/80 | HR 76 | Temp 98.0°F | Ht 66.0 in | Wt 132.2 lb

## 2012-05-25 DIAGNOSIS — R194 Change in bowel habit: Secondary | ICD-10-CM

## 2012-05-25 DIAGNOSIS — Z1211 Encounter for screening for malignant neoplasm of colon: Secondary | ICD-10-CM

## 2012-05-25 DIAGNOSIS — K5900 Constipation, unspecified: Secondary | ICD-10-CM

## 2012-05-25 DIAGNOSIS — R195 Other fecal abnormalities: Secondary | ICD-10-CM | POA: Diagnosis not present

## 2012-05-25 NOTE — Telephone Encounter (Signed)
Patient needs movi prep 

## 2012-05-25 NOTE — Progress Notes (Addendum)
Subjective:     Patient ID: Audrey Bird, female   DOB: Oct 14, 1960, 51 y.o.   MRN: [id number]  HPI Referred to our office by Dr. Jeanice Lim for chronic nausea and constipation. She tells me she has constipation.  She tells me she will go 2-3 weeks without having a BM. Last week she had a BM and afterwards she saw about a couple of blood. She has frequent abdominal cramps trying to have a BM. Symptoms x 6 months - 8 months.   She was having a BM about every 2-3 days. Appetite is good, unless she is hurting in her rt hip. She tells me she lost 10 pounds in the past 6 weeks but has gained 2 pounds back.  When she hurt in her rt hip she has nausea. No NSAIDs or ASA. She is seeing Dr. Melina Fiddler at the pain clinic in [facility]  She underwent a HemorrhoidectomyColonoscopy 08/30/06 by Dr. Cleotis Nipper: Several large internal hemorrhoids h=which were ligated.  One other small external hemorrhoidal. No colonic or rectal polyps. Normal appearing cecum, ileocecal valve, and appendiceal orifice were identified. EGD/ 10/10/2010 Dr. Karilyn Cota: Multiple (5) bulbar ulcers with clean base. Multiple gastric erosions with 2 small ulcer at antrum.  She had been consuming BC powder anywhere from 2-7 doses per day over the past few weeks.   Review of Systems see hpi Current Outpatient Prescriptions  Medication Sig Dispense Refill  . amLODipine (NORVASC) 10 MG tablet Take 1 tablet (10 mg total) by mouth daily.  30 tablet  3  . cyclobenzaprine (FLEXERIL) 10 MG tablet Take 10 mg by mouth as needed.      Marland Kitchen HYDROcodone-acetaminophen (LORCET) 10-650 MG per tablet Take 1 tablet by mouth 2 (two) times daily as needed.      Marland Kitchen lisinopril (PRINIVIL,ZESTRIL) 20 MG tablet Take 1 tablet (20 mg total) by mouth daily.  30 tablet  3  . Multiple Vitamin (MULITIVITAMIN WITH MINERALS) TABS Take 1 tablet by mouth daily.      . Pseudoeph-Doxylamine-DM-APAP (NYQUIL PO) Take 2 capsules by mouth as needed. For cold symptoms      . tapentadol  (NUCYNTA) 50 MG TABS Take by mouth every 4 (four) hours as needed.    0  . Tapentadol HCl (NUCYNTA ER) 100 MG TB12 Take 1 tablet by mouth every morning.      . promethazine (PHENERGAN) 25 MG suppository Place 1 suppository (25 mg total) rectally every 6 (six) hours as needed for nausea.  12 each  3   Past Medical History  Diagnosis Date  . Hypertension   . Hyperlipidemia   . Anxiety   . Hip pain, right     chronic  . Fracture of pelvis     history of  . Lumbago     history of  . S/P cardiac cath 04/07/10    mild 3 vessel disease, cornary spasm   . History of stomach ulcers 2011    Overuse of NSAIDS   History   Social History  . Marital Status: Married    Spouse Name: N/A    Number of Children: N/A  . Years of Education: N/A   Occupational History  . Not on file.   Social History Main Topics  . Smoking status: Never Smoker   . Smokeless tobacco: Not on file  . Alcohol Use: No  . Drug Use: No  . Sexually Active:    Other Topics Concern  . Not on file   Social History Narrative  .  No narrative on file   Family Status  Relation Status Death Age  . Father Alive     good health  . Mother Alive     goodl health  . Sister Alive     good health  . Brother Alive     both in good health  . Brother Alive    No Known Allergies      Objective:   Physical Exam  Filed Vitals:   05/25/12 1103  Height: 5\' 6"  (1.676 m)  Weight: 132 lb 3.2 oz (59.966 kg)   Alert and oriented. Skin warm and dry. Oral mucosa is moist.   . Sclera anicteric, conjunctivae is pink. Thyroid not enlarged. No cervical lymphadenopathy. Lungs clear. Heart regular rate and rhythm.  Abdomen is soft. Bowel sounds are positive. No hepatomegaly. No abdominal masses felt. No tenderness.  No edema to lower extremities. Patient is alert and oriented.      Assessment:   Constipation, Change in bowel habits.  Colonic neoplasm needs to be ruled out.  Constipation probably narcotic induced.    Plan:     Colonoscopy with Dr. Karilyn Cota. Linzess samples given to patient

## 2012-05-25 NOTE — Patient Instructions (Addendum)
Colonoscopy with Dr. Karilyn Cota.The risks and benefits such as perforation, bleeding, and infection were reviewed with the patient and is agreeable.  Samples of Linzess given to patient. Increase fiber in diet.

## 2012-05-27 ENCOUNTER — Encounter: Payer: Self-pay | Admitting: Family Medicine

## 2012-05-27 MED ORDER — PEG-KCL-NACL-NASULF-NA ASC-C 100 G PO SOLR: 1.0000 | Freq: Once | ORAL | Status: DC

## 2012-06-01 DIAGNOSIS — IMO0001 Reserved for inherently not codable concepts without codable children: Secondary | ICD-10-CM | POA: Diagnosis not present

## 2012-06-01 DIAGNOSIS — M1990 Unspecified osteoarthritis, unspecified site: Secondary | ICD-10-CM | POA: Diagnosis not present

## 2012-06-01 DIAGNOSIS — Z791 Long term (current) use of non-steroidal anti-inflammatories (NSAID): Secondary | ICD-10-CM | POA: Diagnosis not present

## 2012-06-01 DIAGNOSIS — Z7982 Long term (current) use of aspirin: Secondary | ICD-10-CM | POA: Diagnosis not present

## 2012-06-01 DIAGNOSIS — M545 Low back pain: Secondary | ICD-10-CM | POA: Diagnosis not present

## 2012-06-01 DIAGNOSIS — I10 Essential (primary) hypertension: Secondary | ICD-10-CM | POA: Diagnosis not present

## 2012-06-01 DIAGNOSIS — M25559 Pain in unspecified hip: Secondary | ICD-10-CM | POA: Diagnosis not present

## 2012-06-01 DIAGNOSIS — M533 Sacrococcygeal disorders, not elsewhere classified: Secondary | ICD-10-CM | POA: Diagnosis not present

## 2012-06-01 DIAGNOSIS — Z79899 Other long term (current) drug therapy: Secondary | ICD-10-CM | POA: Diagnosis not present

## 2012-06-07 ENCOUNTER — Encounter (HOSPITAL_COMMUNITY): Payer: Self-pay | Admitting: Pharmacy Technician

## 2012-06-21 ENCOUNTER — Ambulatory Visit: Payer: Medicare Other | Admitting: Family Medicine

## 2012-06-22 ENCOUNTER — Encounter (HOSPITAL_COMMUNITY)
Admission: RE | Disposition: A | Discharge status: Home / Self Care | Payer: Self-pay | Source: Ambulatory Visit | Attending: Internal Medicine

## 2012-06-22 ENCOUNTER — Encounter (HOSPITAL_COMMUNITY): Payer: Self-pay | Admitting: *Deleted

## 2012-06-22 ENCOUNTER — Ambulatory Visit (HOSPITAL_COMMUNITY)
Admission: RE | Admit: 2012-06-22 | Discharge: 2012-06-22 | Disposition: A | Discharge status: Home / Self Care | Payer: Medicare Other | Source: Ambulatory Visit | Attending: Internal Medicine | Admitting: Internal Medicine

## 2012-06-22 DIAGNOSIS — E785 Hyperlipidemia, unspecified: Secondary | ICD-10-CM | POA: Diagnosis not present

## 2012-06-22 DIAGNOSIS — K6389 Other specified diseases of intestine: Secondary | ICD-10-CM | POA: Diagnosis not present

## 2012-06-22 DIAGNOSIS — K5730 Diverticulosis of large intestine without perforation or abscess without bleeding: Secondary | ICD-10-CM | POA: Insufficient documentation

## 2012-06-22 DIAGNOSIS — L909 Atrophic disorder of skin, unspecified: Secondary | ICD-10-CM | POA: Insufficient documentation

## 2012-06-22 DIAGNOSIS — D126 Benign neoplasm of colon, unspecified: Secondary | ICD-10-CM | POA: Diagnosis not present

## 2012-06-22 DIAGNOSIS — I10 Essential (primary) hypertension: Secondary | ICD-10-CM | POA: Diagnosis not present

## 2012-06-22 DIAGNOSIS — R194 Change in bowel habit: Secondary | ICD-10-CM

## 2012-06-22 DIAGNOSIS — K644 Residual hemorrhoidal skin tags: Secondary | ICD-10-CM | POA: Diagnosis not present

## 2012-06-22 DIAGNOSIS — L919 Hypertrophic disorder of the skin, unspecified: Secondary | ICD-10-CM | POA: Insufficient documentation

## 2012-06-22 DIAGNOSIS — K5900 Constipation, unspecified: Secondary | ICD-10-CM | POA: Diagnosis not present

## 2012-06-22 DIAGNOSIS — K921 Melena: Secondary | ICD-10-CM | POA: Insufficient documentation

## 2012-06-22 DIAGNOSIS — R198 Other specified symptoms and signs involving the digestive system and abdomen: Secondary | ICD-10-CM

## 2012-06-22 HISTORY — PX: COLONOSCOPY: SHX5424

## 2012-06-22 SURGERY — COLONOSCOPY
Anesthesia: Moderate Sedation

## 2012-06-22 MED ORDER — PSYLLIUM 28 % PO PACK: 1.0000 | PACK | Freq: Every day | ORAL | Status: DC

## 2012-06-22 MED ORDER — MEPERIDINE HCL 50 MG/ML IJ SOLN
INTRAMUSCULAR | Status: DC | PRN
  Administered 2012-06-22 (×2): 25 mg via INTRAVENOUS

## 2012-06-22 MED ORDER — MIDAZOLAM HCL 5 MG/5ML IJ SOLN
INTRAMUSCULAR | Status: DC | PRN
  Administered 2012-06-22: 2 mg via INTRAVENOUS
  Administered 2012-06-22: 3 mg via INTRAVENOUS
  Administered 2012-06-22: 1 mg via INTRAVENOUS
  Administered 2012-06-22 (×2): 2 mg via INTRAVENOUS

## 2012-06-22 MED ORDER — STERILE WATER FOR IRRIGATION IR SOLN
Status: DC | PRN
  Administered 2012-06-22: 12:00:00

## 2012-06-22 MED ORDER — MIDAZOLAM HCL 5 MG/5ML IJ SOLN
INTRAMUSCULAR | Status: AC
  Filled 2012-06-22: qty 10

## 2012-06-22 MED ORDER — SODIUM CHLORIDE 0.45 % IV SOLN
INTRAVENOUS | Status: DC
  Administered 2012-06-22: 12:00:00 via INTRAVENOUS

## 2012-06-22 MED ORDER — MEPERIDINE HCL 50 MG/ML IJ SOLN
INTRAMUSCULAR | Status: AC
  Filled 2012-06-22: qty 1

## 2012-06-22 NOTE — H&P (Signed)
Audrey Bird is an 51 y.o. female.   Chief Complaint: Patient is here for colonoscopy. HPI: Patient is 51 year old Caucasian female who presents with worsening constipation and not responding to therapy. She also has intermittent hematochezia. Patient's last colonoscopy was 6 years ago. Her bowels are moving somewhat better with Linzess  but shr still has evidence of incomplete evacuation. Her appetite is fair she denies weight loss. Family  history is negative for colorectal carcinoma.  Past Medical History  Diagnosis Date  . Hypertension   . Hyperlipidemia   . Anxiety   . Hip pain, right     chronic  . Fracture of pelvis     history of  . Lumbago     history of  . S/P cardiac cath 04/07/10    mild 3 vessel disease, cornary spasm   . History of stomach ulcers 2011    Overuse of NSAIDS    Past Surgical History  Procedure Date  . Hemorrhoid surgery   . Cystectomy     hand    Family History  Problem Relation Age of Onset  . Cancer Father     skin  . Aneurysm Father     brain  . Cancer Mother     thyroid    Social History:  reports that she has never smoked. She does not have any smokeless tobacco history on file. She reports that she does not drink alcohol or use illicit drugs.  Allergies: No Known Allergies  Medications Prior to Admission  Medication Sig Dispense Refill  . amLODipine (NORVASC) 10 MG tablet Take 1 tablet (10 mg total) by mouth daily.  30 tablet  3  . cholecalciferol (VITAMIN D) 1000 UNITS tablet Take 1,000 Units by mouth daily.      . cyclobenzaprine (FLEXERIL) 10 MG tablet Take 10 mg by mouth 3 (three) times daily as needed. Muscle Spasms      . HYDROcodone-acetaminophen (NORCO/VICODIN) 5-325 MG per tablet Take 1 tablet by mouth every 6 (six) hours as needed. Pain      . Linaclotide (LINZESS) 290 MCG CAPS Take 1 tablet by mouth at bedtime.      . Multiple Vitamin (MULITIVITAMIN WITH MINERALS) TABS Take 1 tablet by mouth daily.      . peg 3350 powder  (MOVIPREP) 100 G SOLR Take 1 kit (100 g total) by mouth once.  1 kit  0  . Aspirin-Salicylamide-Caffeine (BC HEADACHE POWDER PO) Take 1 packet by mouth every 8 (eight) hours as needed. Pain      . lisinopril (PRINIVIL,ZESTRIL) 20 MG tablet Take 1 tablet (20 mg total) by mouth daily.  30 tablet  3    No results found for this or any previous visit (from the past 48 hour(s)). No results found.  ROS  Blood pressure 140/91, pulse 82, temperature 97.9 F (36.6 C), temperature source Oral, resp. rate 24, height 5\' 6"  (1.676 m), weight 130 lb (58.968 kg), SpO2 97.00%. Physical Exam  Constitutional: She appears well-developed and well-nourished.  HENT:  Mouth/Throat: Oropharynx is clear and moist.  Eyes: Conjunctivae normal are normal. No scleral icterus.  Neck: No thyromegaly present.  Cardiovascular: Normal rate, regular rhythm and normal heart sounds.   No murmur heard. Respiratory: Effort normal.  GI: Soft. She exhibits no distension. There is no tenderness.  Musculoskeletal: She exhibits no edema.  Lymphadenopathy:    She has no cervical adenopathy.  Neurological: She is alert.  Skin: Skin is warm and dry.  Assessment/Plan Change in bowel habits(constipation) and hematochezia. Diagnostic colonoscopy.   Panayiotis Rainville U 06/22/2012, 12:30 PM

## 2012-06-22 NOTE — Op Note (Signed)
COLONOSCOPY PROCEDURE REPORT  PATIENT:  Audrey Bird  MR#:  [id number] Birthdate:  [birth year], 51 y.o., female Endoscopist:  Dr. Malissa Hippo, MD Referred By:  Dr. Redmond Baseman,, MD Procedure Date: 06/22/2012  Procedure:   Colonoscopy with snare polypectomy and single Hemoclip application.  Indications: Patient is 52 year old Caucasian female with change in her habits and intermittent hematochezia.  Informed Consent:  The procedure and risks were reviewed with the patient and informed consent was obtained.  Medications:  Demerol 50 mg IV Versed 10 mg IV  Description of procedure:  After a digital rectal exam was performed, that colonoscope was advanced from the anus through the rectum and colon to the area of the cecum, ileocecal valve and appendiceal orifice. The cecum was deeply intubated. These structures were well-seen and photographed for the record. From the level of the cecum and ileocecal valve, the scope was slowly and cautiously withdrawn. The mucosal surfaces were carefully surveyed utilizing scope tip to flexion to facilitate fold flattening as needed. The scope was pulled down into the rectum where a thorough exam including retroflexion was performed.  Findings:   Prep excellent. 8mm polyp snared from proximal transverse colon. Hemoclip applied to polypectomy site as the center not well coagulated. 10 mm polyp snared from proximal transverse colon. Both of these polyps were submitted together. Few scattered diverticula at sigmoid and descending colon. Normal rectal mucosa. Single anal papilla and small hemorrhoids below the dentate line. Soft sentinel skin tag noted anterior to anal orifice.  Therapeutic/Diagnostic Maneuvers Performed:  See above  Complications:  None  Cecal Withdrawal Time:  22 minutes  Impression:  Examination performed to cecum. 2 polyps measuring 8 and 10 mm snared from proximal transverse colon and submitted together. Hemoclip  applied to one polypectomy site as it was not well coagulated during snare polypectomy. Few diverticula at sigmoid and descending colon. Small external hemorrhoids, single anal papilla and sentinel skin tags.  Recommendations:  Standard instructions given. Patient informed that she cannot have MRI until Hemoclip has passed. I will contact patient with biopsy results and further recommendations.  Shadow Stiggers U  06/22/2012 1:25 PM  CC: Dr. Redmond Baseman, MD & Dr. Bonnetta Barry ref. provider found

## 2012-06-23 DIAGNOSIS — D126 Benign neoplasm of colon, unspecified: Secondary | ICD-10-CM | POA: Diagnosis not present

## 2012-06-24 ENCOUNTER — Encounter (HOSPITAL_COMMUNITY): Payer: Self-pay | Admitting: Internal Medicine

## 2012-07-04 ENCOUNTER — Encounter (INDEPENDENT_AMBULATORY_CARE_PROVIDER_SITE_OTHER): Payer: Self-pay | Admitting: *Deleted

## 2012-07-15 ENCOUNTER — Emergency Department (HOSPITAL_COMMUNITY): Payer: Medicare Other

## 2012-07-15 ENCOUNTER — Encounter (HOSPITAL_COMMUNITY): Payer: Self-pay | Admitting: Emergency Medicine

## 2012-07-15 ENCOUNTER — Emergency Department (HOSPITAL_COMMUNITY)
Admission: EM | Admit: 2012-07-15 | Discharge: 2012-07-15 | Disposition: A | Discharge status: Home / Self Care | Payer: Medicare Other | Attending: Emergency Medicine | Admitting: Emergency Medicine

## 2012-07-15 DIAGNOSIS — G8929 Other chronic pain: Secondary | ICD-10-CM | POA: Diagnosis not present

## 2012-07-15 DIAGNOSIS — I10 Essential (primary) hypertension: Secondary | ICD-10-CM | POA: Insufficient documentation

## 2012-07-15 DIAGNOSIS — R55 Syncope and collapse: Secondary | ICD-10-CM | POA: Insufficient documentation

## 2012-07-15 DIAGNOSIS — M25559 Pain in unspecified hip: Secondary | ICD-10-CM | POA: Diagnosis not present

## 2012-07-15 DIAGNOSIS — T148XXA Other injury of unspecified body region, initial encounter: Secondary | ICD-10-CM | POA: Diagnosis not present

## 2012-07-15 DIAGNOSIS — Z79899 Other long term (current) drug therapy: Secondary | ICD-10-CM | POA: Insufficient documentation

## 2012-07-15 DIAGNOSIS — R112 Nausea with vomiting, unspecified: Secondary | ICD-10-CM | POA: Insufficient documentation

## 2012-07-15 DIAGNOSIS — R4182 Altered mental status, unspecified: Secondary | ICD-10-CM | POA: Diagnosis not present

## 2012-07-15 DIAGNOSIS — R404 Transient alteration of awareness: Secondary | ICD-10-CM | POA: Diagnosis not present

## 2012-07-15 DIAGNOSIS — F411 Generalized anxiety disorder: Secondary | ICD-10-CM | POA: Diagnosis not present

## 2012-07-15 DIAGNOSIS — R296 Repeated falls: Secondary | ICD-10-CM | POA: Insufficient documentation

## 2012-07-15 DIAGNOSIS — S79919A Unspecified injury of unspecified hip, initial encounter: Secondary | ICD-10-CM | POA: Diagnosis not present

## 2012-07-15 DIAGNOSIS — S79929A Unspecified injury of unspecified thigh, initial encounter: Secondary | ICD-10-CM | POA: Diagnosis not present

## 2012-07-15 DIAGNOSIS — E785 Hyperlipidemia, unspecified: Secondary | ICD-10-CM | POA: Insufficient documentation

## 2012-07-15 LAB — CBC WITH DIFFERENTIAL/PLATELET
Basophils Absolute: 0 10*3/uL (ref 0.0–0.1)
Eosinophils Absolute: 0 10*3/uL (ref 0.0–0.7)
Eosinophils Relative: 0 % (ref 0–5)
HCT: 38.4 % (ref 36.0–46.0)
Lymphocytes Relative: 32 % (ref 12–46)
MCH: 31 pg (ref 26.0–34.0)
MCHC: 34.6 g/dL (ref 30.0–36.0)
MCV: 89.5 fL (ref 78.0–100.0)
Monocytes Absolute: 0.5 10*3/uL (ref 0.1–1.0)
Platelets: 202 10*3/uL (ref 150–400)
RDW: 13.3 % (ref 11.5–15.5)
WBC: 5.4 10*3/uL (ref 4.0–10.5)

## 2012-07-15 LAB — BASIC METABOLIC PANEL
Chloride: 108 mEq/L (ref 96–112)
GFR calc Af Amer: >90 mL/min (ref >90)
GFR calc non Af Amer: >90 mL/min (ref >90)
Potassium: 3.3 mEq/L — ABNORMAL LOW (ref 3.5–5.1)
Sodium: 143 mEq/L (ref 135–145)

## 2012-07-15 LAB — RAPID URINE DRUG SCREEN, HOSP PERFORMED
Amphetamines: NOT DETECTED
Benzodiazepines: NOT DETECTED
Cocaine: NOT DETECTED
Opiates: NOT DETECTED

## 2012-07-15 MED ORDER — OXYCODONE-ACETAMINOPHEN 5-325 MG PO TABS: 1.0000 | ORAL_TABLET | Freq: Four times a day (QID) | ORAL | Status: DC | PRN

## 2012-07-15 MED ORDER — SODIUM CHLORIDE 0.9 % IV BOLUS (SEPSIS)
1000.0000 mL | Freq: Once | INTRAVENOUS | Status: AC
  Administered 2012-07-15: 1000 mL via INTRAVENOUS

## 2012-07-15 MED ORDER — IPRATROPIUM BROMIDE 0.02 % IN SOLN
0.5000 mg | Freq: Once | RESPIRATORY_TRACT | Status: DC
  Filled 2012-07-15: qty 2.5

## 2012-07-15 MED ORDER — HYDROMORPHONE HCL PF 1 MG/ML IJ SOLN
1.0000 mg | Freq: Once | INTRAMUSCULAR | Status: AC
  Administered 2012-07-15: 1 mg via INTRAVENOUS
  Filled 2012-07-15: qty 1

## 2012-07-15 MED ORDER — METHYLPREDNISOLONE SODIUM SUCC 125 MG IJ SOLR
125.0000 mg | Freq: Once | INTRAMUSCULAR | Status: AC
  Administered 2012-07-15: 125 mg via INTRAVENOUS
  Filled 2012-07-15: qty 2

## 2012-07-15 MED ORDER — ALBUTEROL SULFATE (5 MG/ML) 0.5% IN NEBU
5.0000 mg | INHALATION_SOLUTION | Freq: Once | RESPIRATORY_TRACT | Status: DC
  Filled 2012-07-15: qty 1

## 2012-07-15 NOTE — ED Notes (Signed)
Pt husband found pt on floor. EMS did sternal rub with no response. Pt alert/ on arrival to ed. cbg 99 en route. Pt fully immobilized. Pt alert/ tries to answer questions but unable to talk. Appears frustrated with unable to find words to say.pt able to moan. ems states pt syncope again and started biting mouth. ems denies shaking.

## 2012-07-15 NOTE — ED Provider Notes (Signed)
History   This chart was scribed for Geoffery Lyons, MD by Toya Smothers. The patient was seen in room APA02/APA02. Patient's care was started at 1408.  CSN: [id number]  Arrival date & time 07/15/12  1408   First MD Initiated Contact with Patient 07/15/12 1520      Chief Complaint  Patient presents with  . Fall  . Loss of Consciousness   Patient is a 51 y.o. female presenting with fall and syncope. The history is provided by the patient and the spouse. No language interpreter was used.  Fall The accident occurred 1 to 2 hours ago. The fall occurred while walking. She fell from an unknown height. She landed on carpet. There was no blood loss. The point of impact was the right hip and left hip. The pain is present in the left hip and right hip. The pain is severe. She was not ambulatory at the scene. There was no entrapment after the fall. There was no drug use involved in the accident. There was no alcohol use involved in the accident. Associated symptoms include nausea and vomiting. Pertinent negatives include no abdominal pain, no bowel incontinence and no tingling. Treatment on scene includes a c-collar and a backboard. She has tried immobilization for the symptoms. The treatment provided mild relief.  Loss of Consciousness This is a recurrent problem. The current episode started 1 to 2 hours ago. The problem occurs rarely. The problem has been resolved. Pertinent negatives include no abdominal pain and no shortness of breath. Nothing aggravates the symptoms. Nothing relieves the symptoms. She has tried nothing for the symptoms. The treatment provided no relief.    Audrey Bird is a 51 y.o. female who lists a 3 year h/o syncope and chronic hip pain presents to the Emergency Department complaining of 2 hours sudden onset severe episodic syncope and moderate bilateral hip pain after a fall onto carpeted floor. Hip pain is severe, constant, and unchanged. Pt denotes nausea and emesis prior to  syncope. Spouse reports finding Pt on the floor unconscious and unresponsive. He was able to wake Pt and reports she then loss consciousness a second time for 30 min. Episode is consistent with previous episodes of syncope for the past 3 years. Pt lists a h/o HTN, Hyperlipidemia, Anxiety, and fractured pelvis.   Past Medical History  Diagnosis Date  . Hypertension   . Hyperlipidemia   . Anxiety   . Hip pain, right     chronic  . Fracture of pelvis     history of  . Lumbago     history of  . S/P cardiac cath 04/07/10    mild 3 vessel disease, cornary spasm   . History of stomach ulcers 2011    Overuse of NSAIDS    Past Surgical History  Procedure Date  . Hemorrhoid surgery   . Cystectomy     hand  . Colonoscopy 06/22/2012    Procedure: COLONOSCOPY;  Surgeon: Malissa Hippo, MD;  Location: [facility];  Service: Endoscopy;  Laterality: N/A;  100    Family History  Problem Relation Age of Onset  . Cancer Father     skin  . Aneurysm Father     brain  . Cancer Mother     thyroid     History  Substance Use Topics  . Smoking status: Never Smoker   . Smokeless tobacco: Not on file  . Alcohol Use: No   Review of Systems  Respiratory: Negative for shortness of  breath.   Cardiovascular: Positive for syncope.  Gastrointestinal: Positive for nausea and vomiting. Negative for abdominal pain and bowel incontinence.  Neurological: Positive for syncope. Negative for tingling.  All other systems reviewed and are negative.    Allergies  Review of patient's allergies indicates no known allergies.  Home Medications   Current Outpatient Rx  Name Route Sig Dispense Refill  . AMLODIPINE BESYLATE 10 MG PO TABS Oral Take 1 tablet (10 mg total) by mouth daily. 30 tablet 3  . BC HEADACHE POWDER PO Oral Take 1 packet by mouth every 8 (eight) hours as needed. Pain    . VITAMIN D 1000 UNITS PO TABS Oral Take 1,000 Units by mouth daily.    . CYCLOBENZAPRINE HCL 10 MG PO TABS Oral  Take 10 mg by mouth 3 (three) times daily as needed. Muscle Spasms    . HYDROCODONE-ACETAMINOPHEN 5-325 MG PO TABS Oral Take 1 tablet by mouth every 6 (six) hours as needed. Pain    . LINACLOTIDE 290 MCG PO CAPS Oral Take 1 tablet by mouth at bedtime.    . ADULT MULTIVITAMIN W/MINERALS CH Oral Take 1 tablet by mouth daily.      BP 155/87  Pulse 70  Temp 98.1 F (36.7 C) (Oral)  Resp 16  Ht 5\' 8"  (1.727 m)  Wt 140 lb (63.504 kg)  BMI 21.29 kg/m2  SpO2 97%  Physical Exam  Constitutional: She appears well-developed.  HENT:  Head: Normocephalic and atraumatic.  Mouth/Throat: No oropharyngeal exudate.  Eyes: EOM are normal. Pupils are equal, round, and reactive to light. Right eye exhibits no discharge. Left eye exhibits no discharge. No scleral icterus.  Neck: Neck supple. No tracheal deviation present.  Cardiovascular: Normal rate, regular rhythm and normal heart sounds.   Pulmonary/Chest: Effort normal and breath sounds normal. No respiratory distress. She has no wheezes.  Abdominal: Soft. Bowel sounds are normal. She exhibits no distension. There is no tenderness. There is no rebound.  Musculoskeletal: She exhibits no edema.  Lymphadenopathy:    She has no cervical adenopathy.  Neurological: She is alert.  Skin: Skin is warm. No rash noted.    ED Course  Procedures (including critical care time) DIAGNOSTIC STUDIES: Oxygen Saturation is 98% on room air, normal by my interpretation.    COORDINATION OF CARE: 15:25- Evaluated Pt. Pt is awake and alert. Will continue to monitor pt because of severity of pain. 15:35- Ordered CT Head Wo Contrast 1 time imaging  15:35- Ordered DG Hip Complete Right 1 time imaging. 17:24- Rechecked Pt. Pt's state appears to be improving.   Labs Reviewed  BASIC METABOLIC PANEL - Abnormal; Notable for the following:    Potassium 3.3 (*)     All other components within normal limits  URINE RAPID DRUG SCREEN (HOSP PERFORMED)  CBC WITH DIFFERENTIAL    Dg Hip Complete Right  07/15/2012  *RADIOLOGY REPORT*  Clinical Data: Fall with loss of consciousness  RIGHT HIP - COMPLETE 2+ VIEW  Comparison: MRI of the right hip 08/10/2011 and right hip radiographs 04/06/2010  Findings: Pelvic ring and proximal right femur are intact.  No acute fracture is seen.  The right hip is located.  The joint spaces of both hips are maintained; there is no significant degenerative change.  Calcified phleboliths in the pelvis are stable.  IMPRESSION: No acute bony abnormality.   Original Report Authenticated By: Britta Mccreedy, M.D.    Ct Head Wo Contrast  07/15/2012  *RADIOLOGY REPORT*  Clinical Data: 52 year old  female syncope.  Altered level of consciousness.  CT HEAD WITHOUT CONTRAST  Technique:  Contiguous axial images were obtained from the base of the skull through the vertex without contrast.  Comparison: 03/17/2012 and earlier.  Findings: Visualized paranasal sinuses and mastoids are clear. Visualized orbits and scalp soft tissues are within normal limits. Osteopenia.  Calvarium appears intact.  Cerebral volume remains normal. No midline shift, mass effect, or evidence of mass lesion.  No ventriculomegaly. No acute intracranial hemorrhage identified.  Stable gray-white matter differentiation throughout the brain.  No evidence of cortically based acute infarction identified.  No suspicious intracranial vascular hyperdensity.  IMPRESSION: Stable negative noncontrast CT appearance of the brain.   Original Report Authenticated By: Harley Hallmark, M.D.      No diagnosis found.   Date: 07/15/2012  Rate: 70  Rhythm: normal sinus rhythm  QRS Axis: normal  Intervals: normal  ST/T Wave abnormalities: normal  Conduction Disutrbances:none  Narrative Interpretation:   Old EKG Reviewed: unchanged    MDM  The patient presents here after a apparent syncopal episode.  She has a history of similar episodes and is due to see a neurologist, however the appointment never  happened.  She has a history of chronic hip pain and seems to have these episodes when her pain flares up.  The workup today is unremarkable, including head ct, labs, and ekg.  At this point, she is feeling better with the meds given and will be discharged to home.    I am uncertain as to the etiology of these episodes.  Seizures are a possibility, however there has not been any shaking witnessed.  I suspect there is a vasovagal component to her flareups of pain.  Either way, she needs to see her pcp next week.  The family has requested a stronger pain pill which I am happy to provide in the short term.    I personally performed the services described in this documentation, which was scribed in my presence. The recorded information has been reviewed and considered.      Geoffery Lyons, MD 07/15/12 1736

## 2012-07-15 NOTE — ED Notes (Signed)
Pt husband states patient has been having "passing out" spells for the past 2 years off and on. Pt is able to speak clearly at this time. Pt states the only thing she knows of that happens the same is that she doesn't feel good before passing out. Pt reports having nausea and vomiting today from her pain in her right hip/pelvis.

## 2012-07-19 DIAGNOSIS — M79609 Pain in unspecified limb: Secondary | ICD-10-CM | POA: Diagnosis not present

## 2012-07-19 DIAGNOSIS — R109 Unspecified abdominal pain: Secondary | ICD-10-CM | POA: Diagnosis not present

## 2012-07-19 DIAGNOSIS — M549 Dorsalgia, unspecified: Secondary | ICD-10-CM | POA: Diagnosis not present

## 2012-07-19 DIAGNOSIS — Z5181 Encounter for therapeutic drug level monitoring: Secondary | ICD-10-CM | POA: Diagnosis not present

## 2012-07-19 DIAGNOSIS — Z79899 Other long term (current) drug therapy: Secondary | ICD-10-CM | POA: Diagnosis not present

## 2012-07-29 ENCOUNTER — Telehealth: Payer: Self-pay | Admitting: Family Medicine

## 2012-07-29 NOTE — Telephone Encounter (Signed)
Please call and triage pt

## 2012-07-29 NOTE — Telephone Encounter (Signed)
Please advise 

## 2012-07-29 NOTE — Telephone Encounter (Signed)
Called and left message for patient to return call.  

## 2012-08-01 ENCOUNTER — Encounter (INDEPENDENT_AMBULATORY_CARE_PROVIDER_SITE_OTHER): Payer: Self-pay

## 2012-08-02 NOTE — Telephone Encounter (Signed)
Patient never returned call  

## 2012-08-29 DIAGNOSIS — M62838 Other muscle spasm: Secondary | ICD-10-CM | POA: Diagnosis not present

## 2012-08-29 DIAGNOSIS — M533 Sacrococcygeal disorders, not elsewhere classified: Secondary | ICD-10-CM | POA: Diagnosis not present

## 2012-08-29 DIAGNOSIS — R109 Unspecified abdominal pain: Secondary | ICD-10-CM | POA: Diagnosis not present

## 2012-08-29 DIAGNOSIS — M545 Low back pain: Secondary | ICD-10-CM | POA: Diagnosis not present

## 2012-09-06 ENCOUNTER — Ambulatory Visit (INDEPENDENT_AMBULATORY_CARE_PROVIDER_SITE_OTHER): Payer: Medicare Other | Admitting: Family Medicine

## 2012-09-06 ENCOUNTER — Encounter: Payer: Self-pay | Admitting: Family Medicine

## 2012-09-06 VITALS — BP 180/126 | HR 100 | Resp 18 | Ht 65.5 in | Wt 137.0 lb

## 2012-09-06 DIAGNOSIS — M129 Arthropathy, unspecified: Secondary | ICD-10-CM | POA: Diagnosis not present

## 2012-09-06 DIAGNOSIS — I10 Essential (primary) hypertension: Secondary | ICD-10-CM

## 2012-09-06 DIAGNOSIS — M1990 Unspecified osteoarthritis, unspecified site: Secondary | ICD-10-CM | POA: Insufficient documentation

## 2012-09-06 DIAGNOSIS — F341 Dysthymic disorder: Secondary | ICD-10-CM

## 2012-09-06 DIAGNOSIS — R55 Syncope and collapse: Secondary | ICD-10-CM

## 2012-09-06 DIAGNOSIS — F32A Depression, unspecified: Secondary | ICD-10-CM

## 2012-09-06 DIAGNOSIS — F329 Major depressive disorder, single episode, unspecified: Secondary | ICD-10-CM

## 2012-09-06 DIAGNOSIS — F419 Anxiety disorder, unspecified: Secondary | ICD-10-CM | POA: Insufficient documentation

## 2012-09-06 MED ORDER — MELOXICAM 7.5 MG PO TABS: 7.5000 mg | ORAL_TABLET | Freq: Every day | ORAL | Status: DC

## 2012-09-06 MED ORDER — DULOXETINE HCL 30 MG PO CPEP: 30.0000 mg | ORAL_CAPSULE | Freq: Two times a day (BID) | ORAL | Status: DC

## 2012-09-06 NOTE — Progress Notes (Signed)
  Subjective:    Patient ID: Audrey Bird, female    DOB: September 08, 1961, 51 y.o.   MRN: [id number]  HPI Patient here to follow chronic medical problems. Unfortunately our computer system was down at the time and I did not have any records for her. She did not bring her medications with exception of her oxycodone. Her blood pressure has been very elevated running 180 systolic over 100s diastolic. She states she's been taking amlodipine daily. She has some chest heaviness today and yesterday. She denies any shortness of breath or sharp chest pain. She's under a lot of stress because of her pain and feels like is not controlled. Also she is having trouble with her daughter who is now addicted to medications. She's not sleeping very well and feels that she cannot help herself or other people in the church in her current state. Her pain doctor told her to get on arthritis medication from her PCP  Review of Systems  GEN- denies fatigue, fever, weight loss,weakness, recent illness HEENT- denies eye drainage, change in vision, nasal discharge, CVS- denies chest pain, palpitations RESP- denies SOB, cough, wheeze ABD- denies N/V, change in stools, abd pain GU- denies dysuria, hematuria, dribbling, incontinence MSK- + joint pain, muscle aches, injury Neuro- denies headache, dizziness, syncope, seizure activity      Objective:   Physical Exam  GEN- NAD, alert and oriented x3 repeat BP 178/100 both arms HEENT- PERRL, EOMI, non injected sclera, pink conjunctiva, MMM, oropharynx clear Neck- Supple,  CVS- RRR, no murmur RESP-CTAB ABS-NABS,soft,NT,ND EXT- No edema Pulses- Radial, DP- 2+ Psych- depressed affect, no SI, not overly anxious  EKG- NSR, with episode of sinus tachycardia, flat t waves      Assessment & Plan:

## 2012-09-06 NOTE — Assessment & Plan Note (Signed)
Because her drug store as her computer was off one of the time. She's not taking the lisinopril she's not had this filled since July she be and she should be on 20 mg along with amlodipine which they state she did not have filled since October. She states she does have the medications at home therefore there is question of compliance at this time [facility] is to take both lisinopril and the norvasc

## 2012-09-06 NOTE — Assessment & Plan Note (Signed)
She's never been seen by psychiatrist or therapist and does not want to at this time. Will start her on Cymbalta 30 mg twice a day. This may help with her chronic pain as well.

## 2012-09-06 NOTE — Patient Instructions (Signed)
Pt given handwritten instructions at discharge F/U 4 weeks

## 2012-09-06 NOTE — Assessment & Plan Note (Signed)
Start meloxicam.  

## 2012-09-06 NOTE — Assessment & Plan Note (Signed)
She had another episode and was seen in the emergency room on October however she declined admission for workup. Of note her UDS was negative including for opiates which she is on chronic long-term. I will refer her to neurology she's been to cardiology without any cause of her syncopal events. At this time the working diagnosis severe pain and elevated blood pressure at during the time of his meds. She has had an MRI and MRA 2000 with her which was negative and an EEG

## 2012-09-19 ENCOUNTER — Encounter: Payer: Self-pay | Admitting: Family Medicine

## 2012-09-20 ENCOUNTER — Telehealth: Payer: Self-pay | Admitting: Family Medicine

## 2012-09-20 NOTE — Telephone Encounter (Signed)
Patient is aware 

## 2012-09-22 DIAGNOSIS — N949 Unspecified condition associated with female genital organs and menstrual cycle: Secondary | ICD-10-CM | POA: Diagnosis not present

## 2012-09-22 DIAGNOSIS — R55 Syncope and collapse: Secondary | ICD-10-CM | POA: Diagnosis not present

## 2012-09-22 DIAGNOSIS — I10 Essential (primary) hypertension: Secondary | ICD-10-CM | POA: Diagnosis not present

## 2012-10-10 ENCOUNTER — Other Ambulatory Visit: Payer: Self-pay | Admitting: Family Medicine

## 2012-10-11 ENCOUNTER — Ambulatory Visit: Payer: Medicare Other | Admitting: Family Medicine

## 2012-10-22 DIAGNOSIS — M25559 Pain in unspecified hip: Secondary | ICD-10-CM | POA: Diagnosis not present

## 2012-10-22 DIAGNOSIS — R112 Nausea with vomiting, unspecified: Secondary | ICD-10-CM | POA: Diagnosis not present

## 2012-10-22 DIAGNOSIS — I10 Essential (primary) hypertension: Secondary | ICD-10-CM | POA: Diagnosis not present

## 2012-10-22 DIAGNOSIS — G8929 Other chronic pain: Secondary | ICD-10-CM | POA: Diagnosis not present

## 2012-10-22 DIAGNOSIS — R55 Syncope and collapse: Secondary | ICD-10-CM | POA: Diagnosis not present

## 2012-10-22 DIAGNOSIS — Z79899 Other long term (current) drug therapy: Secondary | ICD-10-CM | POA: Diagnosis not present

## 2012-12-01 ENCOUNTER — Emergency Department (HOSPITAL_COMMUNITY): Payer: Medicare Other

## 2012-12-01 ENCOUNTER — Emergency Department (HOSPITAL_COMMUNITY)
Admission: EM | Admit: 2012-12-01 | Discharge: 2012-12-01 | Disposition: A | Discharge status: Home / Self Care | Payer: Medicare Other | Attending: Emergency Medicine | Admitting: Emergency Medicine

## 2012-12-01 ENCOUNTER — Encounter (HOSPITAL_COMMUNITY): Payer: Self-pay | Admitting: Emergency Medicine

## 2012-12-01 DIAGNOSIS — R209 Unspecified disturbances of skin sensation: Secondary | ICD-10-CM | POA: Insufficient documentation

## 2012-12-01 DIAGNOSIS — Z8781 Personal history of (healed) traumatic fracture: Secondary | ICD-10-CM | POA: Diagnosis not present

## 2012-12-01 DIAGNOSIS — Z8739 Personal history of other diseases of the musculoskeletal system and connective tissue: Secondary | ICD-10-CM | POA: Diagnosis not present

## 2012-12-01 DIAGNOSIS — N951 Menopausal and female climacteric states: Secondary | ICD-10-CM | POA: Diagnosis not present

## 2012-12-01 DIAGNOSIS — I10 Essential (primary) hypertension: Secondary | ICD-10-CM | POA: Diagnosis not present

## 2012-12-01 DIAGNOSIS — F411 Generalized anxiety disorder: Secondary | ICD-10-CM | POA: Insufficient documentation

## 2012-12-01 DIAGNOSIS — E785 Hyperlipidemia, unspecified: Secondary | ICD-10-CM | POA: Diagnosis not present

## 2012-12-01 DIAGNOSIS — R1110 Vomiting, unspecified: Secondary | ICD-10-CM | POA: Insufficient documentation

## 2012-12-01 DIAGNOSIS — R6889 Other general symptoms and signs: Secondary | ICD-10-CM | POA: Diagnosis not present

## 2012-12-01 DIAGNOSIS — R0789 Other chest pain: Secondary | ICD-10-CM | POA: Diagnosis not present

## 2012-12-01 DIAGNOSIS — Z79899 Other long term (current) drug therapy: Secondary | ICD-10-CM | POA: Insufficient documentation

## 2012-12-01 DIAGNOSIS — Z8719 Personal history of other diseases of the digestive system: Secondary | ICD-10-CM | POA: Diagnosis not present

## 2012-12-01 DIAGNOSIS — M25559 Pain in unspecified hip: Secondary | ICD-10-CM | POA: Diagnosis not present

## 2012-12-01 DIAGNOSIS — R4182 Altered mental status, unspecified: Secondary | ICD-10-CM | POA: Diagnosis not present

## 2012-12-01 DIAGNOSIS — H538 Other visual disturbances: Secondary | ICD-10-CM | POA: Insufficient documentation

## 2012-12-01 DIAGNOSIS — G8929 Other chronic pain: Secondary | ICD-10-CM | POA: Insufficient documentation

## 2012-12-01 DIAGNOSIS — R112 Nausea with vomiting, unspecified: Secondary | ICD-10-CM | POA: Insufficient documentation

## 2012-12-01 DIAGNOSIS — R404 Transient alteration of awareness: Secondary | ICD-10-CM | POA: Diagnosis not present

## 2012-12-01 LAB — COMPREHENSIVE METABOLIC PANEL
ALT: 17 U/L (ref 0–35)
Alkaline Phosphatase: 66 U/L (ref 39–117)
BUN: 15 mg/dL (ref 6–23)
CO2: 26 mEq/L (ref 19–32)
Chloride: 107 mEq/L (ref 96–112)
GFR calc Af Amer: >90 mL/min (ref >90)
Glucose, Bld: 76 mg/dL (ref 70–99)
Potassium: 3.5 mEq/L (ref 3.5–5.1)
Sodium: 141 mEq/L (ref 135–145)
Total Bilirubin: 0.2 mg/dL — ABNORMAL LOW (ref 0.3–1.2)
Total Protein: 6.5 g/dL (ref 6.0–8.3)

## 2012-12-01 LAB — URINALYSIS, ROUTINE W REFLEX MICROSCOPIC
Bilirubin Urine: NEGATIVE
Ketones, ur: NEGATIVE mg/dL
Nitrite: NEGATIVE
Protein, ur: NEGATIVE mg/dL
pH: 6.5 (ref 5.0–8.0)

## 2012-12-01 LAB — CBC WITH DIFFERENTIAL/PLATELET
Eosinophils Absolute: 0.1 10*3/uL (ref 0.0–0.7)
Hemoglobin: 13.2 g/dL (ref 12.0–15.0)
Lymphocytes Relative: 44 % (ref 12–46)
Lymphs Abs: 2.2 10*3/uL (ref 0.7–4.0)
MCH: 31.4 pg (ref 26.0–34.0)
Monocytes Relative: 6 % (ref 3–12)
Neutro Abs: 2.4 10*3/uL (ref 1.7–7.7)
Neutrophils Relative %: 48 % (ref 43–77)
Platelets: 199 10*3/uL (ref 150–400)
RBC: 4.21 MIL/uL (ref 3.87–5.11)
WBC: 5.1 10*3/uL (ref 4.0–10.5)

## 2012-12-01 LAB — TROPONIN I: Troponin I: <0.3 ng/mL (ref <0.30)

## 2012-12-01 NOTE — ED Provider Notes (Signed)
History     CSN: [id number]  Arrival date & time 12/01/12  1805   First MD Initiated Contact with Patient 12/01/12 1827      Chief Complaint  Patient presents with  . Altered Mental Status  . Emesis    (Consider location/radiation/quality/duration/timing/severity/associated sxs/prior treatment) HPI  Past Medical History  Diagnosis Date  . Hypertension   . Hyperlipidemia   . Anxiety   . Hip pain, right     chronic  . Fracture of pelvis     history of  . Lumbago     history of  . S/P cardiac cath 04/07/10    mild 3 vessel disease, cornary spasm   . History of stomach ulcers 2011    Overuse of NSAIDS    Past Surgical History  Procedure Laterality Date  . Hemorrhoid surgery    . Cystectomy      hand  . Colonoscopy  06/22/2012    Procedure: COLONOSCOPY;  Surgeon: Malissa Hippo, MD;  Location: [facility];  Service: Endoscopy;  Laterality: N/A;  100    Family History  Problem Relation Age of Onset  . Cancer Father     skin  . Aneurysm Father     brain  . Cancer Mother     thyroid     History  Substance Use Topics  . Smoking status: Never Smoker   . Smokeless tobacco: Not on file  . Alcohol Use: No    OB History   Grav Para Term Preterm Abortions TAB SAB Ect Mult Living                  Review of Systems  Allergies  Review of patient's allergies indicates no known allergies.  Home Medications   Current Outpatient Rx  Name  Route  Sig  Dispense  Refill  . Aspirin-Salicylamide-Caffeine (BC HEADACHE POWDER PO)   Oral   Take 1 packet by mouth every 8 (eight) hours as needed. Pain         . cholecalciferol (VITAMIN D) 1000 UNITS tablet   Oral   Take 1,000 Units by mouth daily.         . DULoxetine (CYMBALTA) 30 MG capsule   Oral   Take 30 mg by mouth 2 (two) times daily.         Marland Kitchen HYDROcodone-acetaminophen (NORCO/VICODIN) 5-325 MG per tablet   Oral   Take 1 tablet by mouth every 6 (six) hours as needed. Pain         .  Linaclotide (LINZESS) 290 MCG CAPS   Oral   Take 1 tablet by mouth at bedtime.         Marland Kitchen lisinopril (PRINIVIL,ZESTRIL) 10 MG tablet   Oral   Take 10 mg by mouth daily.         . Multiple Vitamin (MULITIVITAMIN WITH MINERALS) TABS   Oral   Take 1 tablet by mouth daily.         Marland Kitchen amLODipine (NORVASC) 10 MG tablet   Oral   Take 1 tablet (10 mg total) by mouth daily.   30 tablet   3     BP 148/99  Pulse 85  Temp(Src) 97.8 F (36.6 C)  Resp 16  Ht 5\' 6"  (1.676 m)  Wt 135 lb (61.236 kg)  BMI 21.8 kg/m2  SpO2 100%  Physical Exam  ED Course  Procedures (including critical care time)  Labs Reviewed  COMPREHENSIVE METABOLIC PANEL - Abnormal; Notable for the  following:    Total Bilirubin 0.2 (*)    All other components within normal limits  URINALYSIS, ROUTINE W REFLEX MICROSCOPIC - Abnormal; Notable for the following:    Specific Gravity, Urine <1.005 (*)    All other components within normal limits  CBC WITH DIFFERENTIAL  D-DIMER, QUANTITATIVE  PRO B NATRIURETIC PEPTIDE  TROPONIN I   Dg Chest 2 View  12/01/2012  *RADIOLOGY REPORT*  Clinical Data: Altered mental status.  Nausea and vomiting.  CHEST - 2 VIEW  Comparison: 03/18/2012 and 01/28/2011  Findings: The heart size and pulmonary vascularity are normal and the lungs are clear.  No osseous abnormality.  No effusions.  IMPRESSION: Normal chest.   Original Report Authenticated By: Francene Boyers, M.D.    Ct Head Wo Contrast  12/01/2012  *RADIOLOGY REPORT*  Clinical Data: Altered mental status.  Nausea and vomiting.  The patient was found unresponsive.  Left hand numbness.  CT HEAD WITHOUT CONTRAST  Technique:  Contiguous axial images were obtained from the base of the skull through the vertex without contrast.  Comparison: 07/15/2012  Findings: There is no acute intracranial hemorrhage, infarction, or mass lesion.  Brain parenchyma is normal.  Osseous structures are normal.  IMPRESSION: Negative exam.   Original Report  Authenticated By: Francene Boyers, M.D.      1. Altered mental status      Date: 12/01/2012  Rate: 76  Rhythm: normal sinus rhythm  QRS Axis: normal  Intervals: normal  ST/T Wave abnormalities: nonspecific ST/T changes  Conduction Disutrbances:none  Narrative Interpretation:   Old EKG Reviewed: unchanged    MDM          Benny Lennert, MD 12/01/12 2020

## 2012-12-01 NOTE — ED Provider Notes (Signed)
History    This chart was scribed for Audrey Lennert, MD by Gerlean Ren, ED Scribe. This patient was seen in room [facility] and the patient's care was started at 6:29 PM    CSN: [id number]  Arrival date & time 12/01/12  1805   First MD Initiated Contact with Patient 12/01/12 1827      Chief Complaint  Patient presents with  . Altered Mental Status  . Emesis    The history is provided by the patient and the spouse. No language interpreter was used.  Audrey Bird is a 52 y.o. female with h/o HTN brought in by ambulance to the Emergency Department complaining of, per husband, and altered mental status during which pt started "not feeling right" around 6:30 PM tonight and when husband first made contact she was confused and somewhat unresponsive.  At the time pt c/o left arm pain and numbness, nausea with some non-bloody, non-bilious emesis, and some visual disturbances.  Nausea was improved by phenergan.  Pt states she currently feels much improved other than some residual numbness in left hand.  Pt denies any dyspnea or chest pain at the time but reports mild chest tightness currently.  Pt reports she is currently going through menopause. PCP is Dr. Jeanice Lim. Past Medical History  Diagnosis Date  . Hypertension   . Hyperlipidemia   . Anxiety   . Hip pain, right     chronic  . Fracture of pelvis     history of  . Lumbago     history of  . S/P cardiac cath 04/07/10    mild 3 vessel disease, cornary spasm   . History of stomach ulcers 2011    Overuse of NSAIDS    Past Surgical History  Procedure Laterality Date  . Hemorrhoid surgery    . Cystectomy      hand  . Colonoscopy  06/22/2012    Procedure: COLONOSCOPY;  Surgeon: Malissa Hippo, MD;  Location: [facility];  Service: Endoscopy;  Laterality: N/A;  100    Family History  Problem Relation Age of Onset  . Cancer Father     skin  . Aneurysm Father     brain  . Cancer Mother     thyroid     History  Substance  Use Topics  . Smoking status: Never Smoker   . Smokeless tobacco: Not on file  . Alcohol Use: No    No OB history provided.   Review of Systems  Constitutional: Negative for fatigue.  HENT: Negative for congestion, sinus pressure and ear discharge.   Eyes: Positive for visual disturbance. Negative for discharge.  Respiratory: Positive for chest tightness. Negative for cough and shortness of breath.   Cardiovascular: Negative for chest pain.  Gastrointestinal: Positive for nausea and vomiting. Negative for abdominal pain and diarrhea.  Genitourinary: Negative for frequency and hematuria.  Musculoskeletal: Negative for back pain.  Skin: Negative for rash.  Neurological: Negative for seizures, syncope and headaches.  Psychiatric/Behavioral: Positive for confusion. Negative for hallucinations.    Allergies  Review of patient's allergies indicates no known allergies.  Home Medications   Current Outpatient Rx  Name  Route  Sig  Dispense  Refill  . amLODipine (NORVASC) 10 MG tablet   Oral   Take 1 tablet (10 mg total) by mouth daily.   30 tablet   3   . Aspirin-Salicylamide-Caffeine (BC HEADACHE POWDER PO)   Oral   Take 1 packet by mouth every 8 (eight)  hours as needed. Pain         . cholecalciferol (VITAMIN D) 1000 UNITS tablet   Oral   Take 1,000 Units by mouth daily.         . cyclobenzaprine (FLEXERIL) 10 MG tablet   Oral   Take 10 mg by mouth 3 (three) times daily as needed. Muscle Spasms         . CYMBALTA 30 MG capsule      TAKE 1 CAPSULE TWICE DAILY FOR DEPRESSION OR ANXIETY.   60 capsule   2   . HYDROcodone-acetaminophen (NORCO/VICODIN) 5-325 MG per tablet   Oral   Take 1 tablet by mouth every 6 (six) hours as needed. Pain         . Linaclotide (LINZESS) 290 MCG CAPS   Oral   Take 1 tablet by mouth at bedtime.         . meloxicam (MOBIC) 7.5 MG tablet   Oral   Take 1 tablet (7.5 mg total) by mouth daily.   30 tablet   2   . Multiple  Vitamin (MULITIVITAMIN WITH MINERALS) TABS   Oral   Take 1 tablet by mouth daily.         Marland Kitchen oxyCODONE-acetaminophen (PERCOCET/ROXICET) 5-325 MG per tablet   Oral   Take 1-2 tablets by mouth every 6 (six) hours as needed for pain.   25 tablet   0     Pulse 80  Temp(Src) 97.8 F (36.6 C)  Resp 18  Ht 5\' 6"  (1.676 m)  Wt 135 lb (61.236 kg)  BMI 21.8 kg/m2  SpO2 99%  Physical Exam  Nursing note and vitals reviewed. Constitutional: She is oriented to person, place, and time. She appears well-developed.  HENT:  Head: Normocephalic and atraumatic.  Eyes: Conjunctivae and EOM are normal. No scleral icterus.  Neck: Neck supple. No thyromegaly present.  Cardiovascular: Normal rate and regular rhythm.  Exam reveals no gallop and no friction rub.   No murmur heard. Pulmonary/Chest: No stridor. She has no wheezes. She has no rales. She exhibits no tenderness.  Abdominal: She exhibits no distension. There is no tenderness. There is no rebound.  Musculoskeletal: Normal range of motion. She exhibits no edema.  Lymphadenopathy:    She has no cervical adenopathy.  Neurological: She is oriented to person, place, and time. Coordination normal.  Skin: No rash noted. No erythema.  Psychiatric: She has a normal mood and affect. Her behavior is normal.    ED Course  Procedures (including critical care time) DIAGNOSTIC STUDIES: Oxygen Saturation is 99% on room air, normal by my interpretation.    COORDINATION OF CARE: 6:34 PM- Patient informed of clinical course, understands medical decision-making process, and agrees with plan.  Ordered troponin, urinalysis, D-dimer, pro b natriuretic, CBC, c-met, chest XR, and head CT w/o contrast.  Results for orders placed during the hospital encounter of 12/01/12  CBC WITH DIFFERENTIAL      Result Value Range   WBC 5.1  4.0 - 10.5 K/uL   RBC 4.21  3.87 - 5.11 MIL/uL   Hemoglobin 13.2  12.0 - 15.0 g/dL   HCT 11.9  14.7 - 82.9 %   MCV 91.0  78.0 -  100.0 fL   MCH 31.4  26.0 - 34.0 pg   MCHC 34.5  30.0 - 36.0 g/dL   RDW 56.2  13.0 - 86.5 %   Platelets 199  150 - 400 K/uL   Neutrophils Relative 48  43 -  77 %   Neutro Abs 2.4  1.7 - 7.7 K/uL   Lymphocytes Relative 44  12 - 46 %   Lymphs Abs 2.2  0.7 - 4.0 K/uL   Monocytes Relative 6  3 - 12 %   Monocytes Absolute 0.3  0.1 - 1.0 K/uL   Eosinophils Relative 2  0 - 5 %   Eosinophils Absolute 0.1  0.0 - 0.7 K/uL   Basophils Relative 0  0 - 1 %   Basophils Absolute 0.0  0.0 - 0.1 K/uL  COMPREHENSIVE METABOLIC PANEL      Result Value Range   Sodium 141  135 - 145 mEq/L   Potassium 3.5  3.5 - 5.1 mEq/L   Chloride 107  96 - 112 mEq/L   CO2 26  19 - 32 mEq/L   Glucose, Bld 76  70 - 99 mg/dL   BUN 15  6 - 23 mg/dL   Creatinine, Ser 1.61  0.50 - 1.10 mg/dL   Calcium 9.3  8.4 - 09.6 mg/dL   Total Protein 6.5  6.0 - 8.3 g/dL   Albumin 3.8  3.5 - 5.2 g/dL   AST 23  0 - 37 U/L   ALT 17  0 - 35 U/L   Alkaline Phosphatase 66  39 - 117 U/L   Total Bilirubin 0.2 (*) 0.3 - 1.2 mg/dL   GFR calc non Af Amer >90  >90 mL/min   GFR calc Af Amer >90  >90 mL/min  URINALYSIS, ROUTINE W REFLEX MICROSCOPIC      Result Value Range   Color, Urine YELLOW  YELLOW   APPearance CLEAR  CLEAR   Specific Gravity, Urine <1.005 (*) 1.005 - 1.030   pH 6.5  5.0 - 8.0   Glucose, UA NEGATIVE  NEGATIVE mg/dL   Hgb urine dipstick NEGATIVE  NEGATIVE   Bilirubin Urine NEGATIVE  NEGATIVE   Ketones, ur NEGATIVE  NEGATIVE mg/dL   Protein, ur NEGATIVE  NEGATIVE mg/dL   Urobilinogen, UA 0.2  0.0 - 1.0 mg/dL   Nitrite NEGATIVE  NEGATIVE   Leukocytes, UA NEGATIVE  NEGATIVE  D-DIMER, QUANTITATIVE      Result Value Range   D-Dimer, Quant <0.27  0.00 - 0.48 ug/mL-FEU  PRO B NATRIURETIC PEPTIDE      Result Value Range   Pro B Natriuretic peptide (BNP) 48.8  0 - 125 pg/mL  TROPONIN I      Result Value Range   Troponin I <0.30  <0.30 ng/mL    Dg Chest 2 View  12/01/2012  *RADIOLOGY REPORT*  Clinical Data: Altered  mental status.  Nausea and vomiting.  CHEST - 2 VIEW  Comparison: 03/18/2012 and 01/28/2011  Findings: The heart size and pulmonary vascularity are normal and the lungs are clear.  No osseous abnormality.  No effusions.  IMPRESSION: Normal chest.   Original Report Authenticated By: Francene Boyers, M.D.    Ct Head Wo Contrast  12/01/2012  *RADIOLOGY REPORT*  Clinical Data: Altered mental status.  Nausea and vomiting.  The patient was found unresponsive.  Left hand numbness.  CT HEAD WITHOUT CONTRAST  Technique:  Contiguous axial images were obtained from the base of the skull through the vertex without contrast.  Comparison: 07/15/2012  Findings: There is no acute intracranial hemorrhage, infarction, or mass lesion.  Brain parenchyma is normal.  Osseous structures are normal.  IMPRESSION: Negative exam.   Original Report Authenticated By: Francene Boyers, M.D.      No diagnosis found.  MDM   The chart was scribed for me under my direct supervision.  I personally performed the history, physical, and medical decision making and all procedures in the evaluation of this patient.Audrey Lennert, MD 12/13/12 [phone]

## 2012-12-01 NOTE — ED Notes (Signed)
Radiology called this RN and notified me that while the Pt was in x-ray they found the tourniquet from her IV placement still tied on her arm.  Once Pt returned to the unit Pt was evaluated. No redness or pain reported in the affected arm.  Pt has no complaints at this time.
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
## 2013-07-27 DIAGNOSIS — Z8249 Family history of ischemic heart disease and other diseases of the circulatory system: Secondary | ICD-10-CM | POA: Diagnosis not present

## 2013-07-27 DIAGNOSIS — R55 Syncope and collapse: Secondary | ICD-10-CM | POA: Diagnosis not present

## 2013-07-27 DIAGNOSIS — I200 Unstable angina: Secondary | ICD-10-CM | POA: Diagnosis not present

## 2013-07-27 DIAGNOSIS — G894 Chronic pain syndrome: Secondary | ICD-10-CM | POA: Diagnosis not present

## 2013-07-27 DIAGNOSIS — R079 Chest pain, unspecified: Secondary | ICD-10-CM | POA: Diagnosis not present

## 2013-07-27 DIAGNOSIS — R0789 Other chest pain: Secondary | ICD-10-CM | POA: Diagnosis not present

## 2013-07-27 DIAGNOSIS — F341 Dysthymic disorder: Secondary | ICD-10-CM | POA: Diagnosis not present

## 2013-07-27 DIAGNOSIS — F17200 Nicotine dependence, unspecified, uncomplicated: Secondary | ICD-10-CM | POA: Diagnosis not present

## 2013-07-27 DIAGNOSIS — I10 Essential (primary) hypertension: Secondary | ICD-10-CM | POA: Diagnosis not present

## 2013-07-28 DIAGNOSIS — I059 Rheumatic mitral valve disease, unspecified: Secondary | ICD-10-CM | POA: Diagnosis not present

## 2013-07-28 DIAGNOSIS — R55 Syncope and collapse: Secondary | ICD-10-CM | POA: Diagnosis not present

## 2013-07-28 DIAGNOSIS — I509 Heart failure, unspecified: Secondary | ICD-10-CM | POA: Diagnosis not present

## 2013-07-28 DIAGNOSIS — R079 Chest pain, unspecified: Secondary | ICD-10-CM | POA: Diagnosis not present

## 2013-07-28 DIAGNOSIS — R197 Diarrhea, unspecified: Secondary | ICD-10-CM | POA: Diagnosis not present

## 2013-07-28 DIAGNOSIS — I10 Essential (primary) hypertension: Secondary | ICD-10-CM | POA: Diagnosis not present

## 2013-07-29 DIAGNOSIS — R079 Chest pain, unspecified: Secondary | ICD-10-CM | POA: Diagnosis not present

## 2013-07-31 DIAGNOSIS — R079 Chest pain, unspecified: Secondary | ICD-10-CM | POA: Diagnosis not present

## 2013-08-21 DIAGNOSIS — I10 Essential (primary) hypertension: Secondary | ICD-10-CM | POA: Diagnosis not present

## 2013-08-21 DIAGNOSIS — M549 Dorsalgia, unspecified: Secondary | ICD-10-CM | POA: Diagnosis not present

## 2013-12-12 DIAGNOSIS — M25559 Pain in unspecified hip: Secondary | ICD-10-CM | POA: Diagnosis not present

## 2013-12-12 DIAGNOSIS — G8929 Other chronic pain: Secondary | ICD-10-CM | POA: Diagnosis not present

## 2013-12-12 DIAGNOSIS — I10 Essential (primary) hypertension: Secondary | ICD-10-CM | POA: Diagnosis not present

## 2013-12-14 DIAGNOSIS — M549 Dorsalgia, unspecified: Secondary | ICD-10-CM | POA: Diagnosis not present

## 2013-12-18 ENCOUNTER — Emergency Department (HOSPITAL_COMMUNITY): Payer: Medicare Other

## 2013-12-18 ENCOUNTER — Encounter (HOSPITAL_COMMUNITY): Payer: Self-pay | Admitting: Emergency Medicine

## 2013-12-18 ENCOUNTER — Inpatient Hospital Stay (HOSPITAL_COMMUNITY)
Admission: EM | Admit: 2013-12-18 | Discharge: 2013-12-20 | DRG: 100 | Disposition: A | Discharge status: Home / Self Care | Payer: Medicare Other | Attending: Internal Medicine | Admitting: Internal Medicine

## 2013-12-18 DIAGNOSIS — M1610 Unilateral primary osteoarthritis, unspecified hip: Secondary | ICD-10-CM | POA: Diagnosis present

## 2013-12-18 DIAGNOSIS — M1990 Unspecified osteoarthritis, unspecified site: Secondary | ICD-10-CM | POA: Diagnosis present

## 2013-12-18 DIAGNOSIS — I10 Essential (primary) hypertension: Secondary | ICD-10-CM | POA: Diagnosis present

## 2013-12-18 DIAGNOSIS — G9340 Encephalopathy, unspecified: Secondary | ICD-10-CM | POA: Diagnosis not present

## 2013-12-18 DIAGNOSIS — E876 Hypokalemia: Secondary | ICD-10-CM | POA: Diagnosis present

## 2013-12-18 DIAGNOSIS — R5381 Other malaise: Secondary | ICD-10-CM | POA: Diagnosis not present

## 2013-12-18 DIAGNOSIS — R4182 Altered mental status, unspecified: Secondary | ICD-10-CM | POA: Diagnosis not present

## 2013-12-18 DIAGNOSIS — R4789 Other speech disturbances: Secondary | ICD-10-CM

## 2013-12-18 DIAGNOSIS — N949 Unspecified condition associated with female genital organs and menstrual cycle: Secondary | ICD-10-CM | POA: Diagnosis present

## 2013-12-18 DIAGNOSIS — F341 Dysthymic disorder: Secondary | ICD-10-CM | POA: Diagnosis not present

## 2013-12-18 DIAGNOSIS — M169 Osteoarthritis of hip, unspecified: Secondary | ICD-10-CM | POA: Diagnosis present

## 2013-12-18 DIAGNOSIS — M25559 Pain in unspecified hip: Secondary | ICD-10-CM | POA: Diagnosis present

## 2013-12-18 DIAGNOSIS — R569 Unspecified convulsions: Secondary | ICD-10-CM | POA: Diagnosis not present

## 2013-12-18 DIAGNOSIS — R55 Syncope and collapse: Secondary | ICD-10-CM

## 2013-12-18 DIAGNOSIS — I517 Cardiomegaly: Secondary | ICD-10-CM | POA: Diagnosis not present

## 2013-12-18 DIAGNOSIS — F3289 Other specified depressive episodes: Secondary | ICD-10-CM | POA: Diagnosis present

## 2013-12-18 DIAGNOSIS — F411 Generalized anxiety disorder: Secondary | ICD-10-CM | POA: Diagnosis present

## 2013-12-18 DIAGNOSIS — E785 Hyperlipidemia, unspecified: Secondary | ICD-10-CM | POA: Diagnosis present

## 2013-12-18 DIAGNOSIS — F809 Developmental disorder of speech and language, unspecified: Secondary | ICD-10-CM | POA: Diagnosis not present

## 2013-12-18 DIAGNOSIS — M25551 Pain in right hip: Secondary | ICD-10-CM | POA: Diagnosis present

## 2013-12-18 DIAGNOSIS — G8929 Other chronic pain: Secondary | ICD-10-CM | POA: Diagnosis present

## 2013-12-18 DIAGNOSIS — F329 Major depressive disorder, single episode, unspecified: Secondary | ICD-10-CM

## 2013-12-18 DIAGNOSIS — F419 Anxiety disorder, unspecified: Secondary | ICD-10-CM

## 2013-12-18 DIAGNOSIS — R404 Transient alteration of awareness: Secondary | ICD-10-CM | POA: Diagnosis not present

## 2013-12-18 DIAGNOSIS — F32A Depression, unspecified: Secondary | ICD-10-CM | POA: Diagnosis present

## 2013-12-18 LAB — BASIC METABOLIC PANEL
BUN: 12 mg/dL (ref 6–23)
CALCIUM: 9.5 mg/dL (ref 8.4–10.5)
CO2: 29 mEq/L (ref 19–32)
Chloride: 104 mEq/L (ref 96–112)
Creatinine, Ser: 0.62 mg/dL (ref 0.50–1.10)
GLUCOSE: 85 mg/dL (ref 70–99)
POTASSIUM: 3.1 meq/L — AB (ref 3.7–5.3)
SODIUM: 146 meq/L (ref 137–147)

## 2013-12-18 LAB — CBC WITH DIFFERENTIAL/PLATELET
BASOS ABS: 0 10*3/uL (ref 0.0–0.1)
BASOS PCT: 0 % (ref 0–1)
EOS ABS: 0 10*3/uL (ref 0.0–0.7)
Eosinophils Relative: 1 % (ref 0–5)
HCT: 41 % (ref 36.0–46.0)
Hemoglobin: 14.1 g/dL (ref 12.0–15.0)
LYMPHS ABS: 1.8 10*3/uL (ref 0.7–4.0)
Lymphocytes Relative: 34 % (ref 12–46)
MCH: 30.6 pg (ref 26.0–34.0)
MCHC: 34.4 g/dL (ref 30.0–36.0)
MCV: 88.9 fL (ref 78.0–100.0)
Monocytes Absolute: 0.4 10*3/uL (ref 0.1–1.0)
Monocytes Relative: 7 % (ref 3–12)
NEUTROS PCT: 58 % (ref 43–77)
Neutro Abs: 3.1 10*3/uL (ref 1.7–7.7)
PLATELETS: 221 10*3/uL (ref 150–400)
RBC: 4.61 MIL/uL (ref 3.87–5.11)
RDW: 13.2 % (ref 11.5–15.5)
WBC: 5.3 10*3/uL (ref 4.0–10.5)

## 2013-12-18 LAB — URINALYSIS, ROUTINE W REFLEX MICROSCOPIC
Bilirubin Urine: NEGATIVE
Glucose, UA: NEGATIVE mg/dL
Hgb urine dipstick: NEGATIVE
KETONES UR: NEGATIVE mg/dL
Nitrite: NEGATIVE
PROTEIN: NEGATIVE mg/dL
Specific Gravity, Urine: <1.005 — ABNORMAL LOW (ref 1.005–1.030)
UROBILINOGEN UA: 0.2 mg/dL (ref 0.0–1.0)
pH: 6 (ref 5.0–8.0)

## 2013-12-18 LAB — URINE MICROSCOPIC-ADD ON

## 2013-12-18 LAB — TROPONIN I

## 2013-12-18 MED ORDER — HYDROCHLOROTHIAZIDE 12.5 MG PO CAPS
12.5000 mg | ORAL_CAPSULE | Freq: Every day | ORAL | Status: DC
  Administered 2013-12-19 – 2013-12-20 (×2): 12.5 mg via ORAL
  Filled 2013-12-18 (×2): qty 1

## 2013-12-18 MED ORDER — ENOXAPARIN SODIUM 40 MG/0.4ML ~~LOC~~ SOLN
40.0000 mg | SUBCUTANEOUS | Status: DC
  Administered 2013-12-18 – 2013-12-19 (×2): 40 mg via SUBCUTANEOUS
  Filled 2013-12-18 (×2): qty 0.4

## 2013-12-18 MED ORDER — POTASSIUM CHLORIDE CRYS ER 20 MEQ PO TBCR
40.0000 meq | EXTENDED_RELEASE_TABLET | Freq: Once | ORAL | Status: AC
Start: 2013-12-18 — End: 2013-12-18
  Administered 2013-12-18: 40 meq via ORAL
  Filled 2013-12-18: qty 2

## 2013-12-18 MED ORDER — ACETAMINOPHEN 325 MG PO TABS
650.0000 mg | ORAL_TABLET | Freq: Four times a day (QID) | ORAL | Status: DC | PRN
  Administered 2013-12-19 (×2): 650 mg via ORAL
  Filled 2013-12-18 (×2): qty 2

## 2013-12-18 MED ORDER — ADULT MULTIVITAMIN W/MINERALS CH
1.0000 | ORAL_TABLET | Freq: Every day | ORAL | Status: DC
  Administered 2013-12-19 – 2013-12-20 (×2): 1 via ORAL
  Filled 2013-12-18 (×2): qty 1

## 2013-12-18 MED ORDER — SODIUM CHLORIDE 0.9 % IV SOLN
INTRAVENOUS | Status: AC
  Administered 2013-12-18: via INTRAVENOUS

## 2013-12-18 MED ORDER — ACETAMINOPHEN 650 MG RE SUPP: 650.0000 mg | Freq: Four times a day (QID) | RECTAL | Status: DC | PRN

## 2013-12-18 MED ORDER — ONDANSETRON HCL 4 MG PO TABS
4.0000 mg | ORAL_TABLET | Freq: Four times a day (QID) | ORAL | Status: DC | PRN
  Administered 2013-12-20: 4 mg via ORAL
  Filled 2013-12-18: qty 1

## 2013-12-18 MED ORDER — SODIUM CHLORIDE 0.9 % IJ SOLN
3.0000 mL | Freq: Two times a day (BID) | INTRAMUSCULAR | Status: DC
  Administered 2013-12-18: 3 mL via INTRAVENOUS

## 2013-12-18 MED ORDER — ONDANSETRON HCL 4 MG/2ML IJ SOLN: 4.0000 mg | Freq: Four times a day (QID) | INTRAMUSCULAR | Status: DC | PRN

## 2013-12-18 MED ORDER — SODIUM CHLORIDE 0.9 % IV SOLN
500.0000 mg | Freq: Two times a day (BID) | INTRAVENOUS | Status: DC
  Administered 2013-12-18 – 2013-12-20 (×4): 500 mg via INTRAVENOUS
  Filled 2013-12-18 (×6): qty 5

## 2013-12-18 MED ORDER — ASPIRIN 325 MG PO TABS
325.0000 mg | ORAL_TABLET | Freq: Every day | ORAL | Status: DC
  Administered 2013-12-18 – 2013-12-20 (×3): 325 mg via ORAL
  Filled 2013-12-18 (×3): qty 1

## 2013-12-18 MED ORDER — LEVETIRACETAM 500 MG/5ML IV SOLN
INTRAVENOUS | Status: AC
  Filled 2013-12-18: qty 5
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
## 2013-12-18 NOTE — ED Notes (Signed)
Report given to Misty RN 
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
## 2013-12-18 NOTE — ED Notes (Signed)
Pt vomiting. Will communicate this to MD

## 2013-12-19 ENCOUNTER — Observation Stay (HOSPITAL_COMMUNITY)
Admit: 2013-12-19 | Discharge: 2013-12-19 | Disposition: A | Discharge status: Home / Self Care | Payer: Medicare Other | Attending: Internal Medicine | Admitting: Internal Medicine

## 2013-12-19 ENCOUNTER — Observation Stay (HOSPITAL_COMMUNITY): Payer: Medicare Other

## 2013-12-19 DIAGNOSIS — R5381 Other malaise: Secondary | ICD-10-CM | POA: Diagnosis not present

## 2013-12-19 DIAGNOSIS — R5383 Other fatigue: Secondary | ICD-10-CM | POA: Diagnosis not present

## 2013-12-19 DIAGNOSIS — R4789 Other speech disturbances: Secondary | ICD-10-CM | POA: Insufficient documentation

## 2013-12-19 DIAGNOSIS — G9340 Encephalopathy, unspecified: Secondary | ICD-10-CM | POA: Diagnosis not present

## 2013-12-19 DIAGNOSIS — R55 Syncope and collapse: Secondary | ICD-10-CM | POA: Diagnosis not present

## 2013-12-19 DIAGNOSIS — F809 Developmental disorder of speech and language, unspecified: Secondary | ICD-10-CM | POA: Diagnosis not present

## 2013-12-19 DIAGNOSIS — I10 Essential (primary) hypertension: Secondary | ICD-10-CM | POA: Diagnosis not present

## 2013-12-19 DIAGNOSIS — I517 Cardiomegaly: Secondary | ICD-10-CM | POA: Diagnosis not present

## 2013-12-19 DIAGNOSIS — R569 Unspecified convulsions: Secondary | ICD-10-CM | POA: Diagnosis not present

## 2013-12-19 DIAGNOSIS — R4182 Altered mental status, unspecified: Secondary | ICD-10-CM | POA: Diagnosis not present

## 2013-12-19 DIAGNOSIS — R404 Transient alteration of awareness: Secondary | ICD-10-CM | POA: Diagnosis not present

## 2013-12-19 LAB — BASIC METABOLIC PANEL
BUN: 13 mg/dL (ref 6–23)
CALCIUM: 9.1 mg/dL (ref 8.4–10.5)
CO2: 27 mEq/L (ref 19–32)
CREATININE: 0.61 mg/dL (ref 0.50–1.10)
Chloride: 107 mEq/L (ref 96–112)
GLUCOSE: 89 mg/dL (ref 70–99)
Potassium: 3.7 mEq/L (ref 3.7–5.3)
Sodium: 145 mEq/L (ref 137–147)

## 2013-12-19 LAB — TSH: TSH: 1.281 u[IU]/mL (ref 0.350–4.500)

## 2013-12-19 LAB — LIPID PANEL
Cholesterol: 174 mg/dL (ref 0–200)
HDL: 80 mg/dL (ref >39)
LDL Cholesterol: 81 mg/dL (ref 0–99)
Total CHOL/HDL Ratio: 2.2 RATIO
Triglycerides: 66 mg/dL (ref <150)
VLDL: 13 mg/dL (ref 0–40)

## 2013-12-19 LAB — TROPONIN I: Troponin I: <0.3 ng/mL (ref <0.30)

## 2013-12-19 LAB — HEMOGLOBIN A1C
HEMOGLOBIN A1C: 5.3 % (ref <5.7)
MEAN PLASMA GLUCOSE: 105 mg/dL (ref <117)

## 2013-12-19 LAB — CBC
HCT: 39.1 % (ref 36.0–46.0)
Hemoglobin: 13.4 g/dL (ref 12.0–15.0)
MCH: 30.7 pg (ref 26.0–34.0)
MCHC: 34.3 g/dL (ref 30.0–36.0)
MCV: 89.5 fL (ref 78.0–100.0)
PLATELETS: 198 10*3/uL (ref 150–400)
RBC: 4.37 MIL/uL (ref 3.87–5.11)
RDW: 13.2 % (ref 11.5–15.5)
WBC: 4.2 10*3/uL (ref 4.0–10.5)

## 2013-12-19 LAB — MRSA PCR SCREENING: MRSA by PCR: NEGATIVE

## 2013-12-19 LAB — MAGNESIUM: Magnesium: 1.9 mg/dL (ref 1.5–2.5)

## 2013-12-19 MED ORDER — GABAPENTIN 300 MG PO CAPS
300.0000 mg | ORAL_CAPSULE | Freq: Three times a day (TID) | ORAL | Status: DC
  Administered 2013-12-19 – 2013-12-20 (×3): 300 mg via ORAL
  Filled 2013-12-19 (×4): qty 1

## 2013-12-19 MED ORDER — POTASSIUM CHLORIDE CRYS ER 20 MEQ PO TBCR
20.0000 meq | EXTENDED_RELEASE_TABLET | Freq: Every day | ORAL | Status: DC
  Administered 2013-12-19 – 2013-12-20 (×2): 20 meq via ORAL
  Filled 2013-12-19 (×2): qty 1

## 2013-12-19 MED ORDER — OXYCODONE HCL 5 MG PO TABS
5.0000 mg | ORAL_TABLET | ORAL | Status: DC | PRN
  Administered 2013-12-19 – 2013-12-20 (×5): 5 mg via ORAL
  Filled 2013-12-19 (×5): qty 1

## 2013-12-19 NOTE — Progress Notes (Signed)
EEG Completed; Results Pending  

## 2013-12-19 NOTE — Progress Notes (Signed)
  Echocardiogram 2D Echocardiogram has been performed.  Philipp Deputy 12/19/2013, 3:27 PM
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
## 2013-12-19 NOTE — Progress Notes (Signed)
UR completed 

## 2013-12-19 NOTE — Progress Notes (Signed)
PT TRANSFERRING TO MED/SURG ROOM 305. PT ALERT AND ORIENTED. CONTINUES TO C/O MILD TO MODERATE RT HIP PAIN. APPETITE REMAIN POOR. NO DIFFICULTY W/ SPEECH OR MOVING ANY OF HER EXTREMITIES. DAUGHTER AT BEDSIDE. REPORT CALLED TO VAL RN ON 300.

## 2013-12-20 DIAGNOSIS — R4182 Altered mental status, unspecified: Secondary | ICD-10-CM | POA: Diagnosis not present

## 2013-12-20 DIAGNOSIS — R55 Syncope and collapse: Secondary | ICD-10-CM | POA: Diagnosis not present

## 2013-12-20 DIAGNOSIS — R569 Unspecified convulsions: Secondary | ICD-10-CM | POA: Diagnosis present

## 2013-12-20 DIAGNOSIS — I10 Essential (primary) hypertension: Secondary | ICD-10-CM | POA: Diagnosis not present

## 2013-12-20 LAB — BASIC METABOLIC PANEL
BUN: 10 mg/dL (ref 6–23)
CHLORIDE: 106 meq/L (ref 96–112)
CO2: 30 meq/L (ref 19–32)
CREATININE: 0.7 mg/dL (ref 0.50–1.10)
Calcium: 9.5 mg/dL (ref 8.4–10.5)
GFR calc non Af Amer: >90 mL/min (ref >90)
Glucose, Bld: 90 mg/dL (ref 70–99)
Potassium: 4.3 mEq/L (ref 3.7–5.3)
SODIUM: 145 meq/L (ref 137–147)

## 2013-12-20 MED ORDER — LEVETIRACETAM 500 MG PO TABS: 500.0000 mg | ORAL_TABLET | Freq: Two times a day (BID) | ORAL | Status: DC

## 2013-12-20 MED ORDER — HYDROCHLOROTHIAZIDE 25 MG PO TABS: 25.0000 mg | ORAL_TABLET | Freq: Every day | ORAL | Status: DC

## 2013-12-20 NOTE — Discharge Instructions (Addendum)
Seizure, Adult A seizure is abnormal electrical activity in the brain. Seizures usually last from 30 seconds to 2 minutes. There are various types of seizures. Before a seizure, you may have a warning sensation (aura) that a seizure is about to occur. An aura may include the following symptoms:   Fear or anxiety.  Nausea.  Feeling like the room is spinning (vertigo).  Vision changes, such as seeing flashing lights or spots. Common symptoms during a seizure include:  A change in attention or behavior (altered mental status).  Convulsions with rhythmic jerking movements.  Drooling.  Rapid eye movements.  Grunting.  Loss of bladder and bowel control.  Bitter taste in the mouth.  Tongue biting. After a seizure, you may feel confused and sleepy. You may also have an injury resulting from convulsions during the seizure. HOME CARE INSTRUCTIONS   If you are given medicines, take them exactly as prescribed by your health care provider.  Keep all follow-up appointments as directed by your health care provider.  Do not swim or drive or engage in risky activity during which a seizure could cause further injury to you or others until your health care provider says it is OK.  Get adequate rest.  Teach friends and family what to do if you have a seizure. They should:  Lay you on the ground to prevent a fall.  Put a cushion under your head.  Loosen any tight clothing around your neck.  Turn you on your side. If vomiting occurs, this helps keep your airway clear.  Stay with you until you recover.  Know whether or not you need emergency care. SEEK IMMEDIATE MEDICAL CARE IF:  The seizure lasts longer than 5 minutes.  The seizure is severe or you do not wake up immediately after the seizure.  You have an altered mental status after the seizure.  You are having more frequent or worsening seizures. Someone should drive you to the emergency department or call local emergency  services (911 in [place] MAKE SURE YOU:  Understand these instructions.  Will watch your condition.  Will get help right away if you are not doing well or get worse. Document Released: 09/25/2000 Document Revised: 07/19/2013 Document Reviewed: 05/10/2013 [facility] Patient Information 2014 [place].    DASH Diet The DASH diet stands for "Dietary Approaches to Stop Hypertension." It is a healthy eating plan that has been shown to reduce high blood pressure (hypertension) in as little as 14 days, while also possibly providing other significant health benefits. These other health benefits include reducing the risk of breast cancer after menopause and reducing the risk of type 2 diabetes, heart disease, colon cancer, and stroke. Health benefits also include weight loss and slowing kidney failure in patients with chronic kidney disease.  DIET GUIDELINES  Limit salt (sodium). Your diet should contain less than 1500 mg of sodium daily.  Limit refined or processed carbohydrates. Your diet should include mostly whole grains. Desserts and added sugars should be used sparingly.  Include small amounts of heart-healthy fats. These types of fats include nuts, oils, and tub margarine. Limit saturated and trans fats. These fats have been shown to be harmful in the body. CHOOSING FOODS  The following food groups are based on a 2000 calorie diet. See your Registered Dietitian for individual calorie needs. Grains and Grain Products (6 to 8 servings daily)  Eat More Often: Whole-wheat bread, brown rice, whole-grain or wheat pasta, quinoa, popcorn without added fat or salt (air popped).  Eat Less Often: White bread, white pasta, white rice, cornbread. Vegetables (4 to 5 servings daily)  Eat More Often: Fresh, frozen, and canned vegetables. Vegetables may be raw, steamed, roasted, or grilled with a minimal amount of fat.  Eat Less Often/Avoid: Creamed or fried vegetables. Vegetables in a cheese  sauce. Fruit (4 to 5 servings daily)  Eat More Often: All fresh, canned (in natural juice), or frozen fruits. Dried fruits without added sugar. One hundred percent fruit juice ( cup [237 mL] daily).  Eat Less Often: Dried fruits with added sugar. Canned fruit in light or heavy syrup. [employer], Fish, and Poultry (2 servings or less daily. One serving is 3 to 4 oz [85-114 g]).  Eat More Often: Ninety percent or leaner ground beef, tenderloin, sirloin. Round cuts of beef, chicken breast, [place] breast. All fish. Grill, bake, or broil your meat. Nothing should be fried.  Eat Less Often/Avoid: Fatty cuts of meat, [place], or chicken leg, thigh, or wing. Fried cuts of meat or fish. Dairy (2 to 3 servings)  Eat More Often: Low-fat or fat-free milk, low-fat plain or light yogurt, reduced-fat or part-skim cheese.  Eat Less Often/Avoid: Milk (whole, 2%).Whole milk yogurt. Full-fat cheeses. Nuts, Seeds, and Legumes (4 to 5 servings per week)  Eat More Often: All without added salt.  Eat Less Often/Avoid: Salted nuts and seeds, canned beans with added salt. Fats and Sweets (limited)  Eat More Often: Vegetable oils, tub margarines without trans fats, sugar-free gelatin. Mayonnaise and salad dressings.  Eat Less Often/Avoid: Coconut oils, palm oils, butter, stick margarine, cream, half and half, cookies, candy, pie. FOR MORE INFORMATION The Dash Diet Eating Plan: www.dashdiet.org Document Released: 09/17/2011 Document Revised: 12/21/2011 Document Reviewed: 09/17/2011 [facility] Patient Information 2014 [place], [place].
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
## 2013-12-20 NOTE — Progress Notes (Addendum)
Discharged home with instructions given on medications,and follow up visits,patient verbalized understanding. Prescriptions sent with patient. Accompanied by staff to an awaiting vehicle. 
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
## 2013-12-20 NOTE — Procedures (Signed)
  Audrey A. Merlene Laughter, MD     [email or web address]           HISTORY: The patient is a 53 year old lady who presents with recurrent spells of syncope suspicious for seizures.  MEDICATIONS: Scheduled Meds: . aspirin  325 mg Oral Daily  . enoxaparin (LOVENOX) injection  40 mg Subcutaneous Q24H  . gabapentin  300 mg Oral TID  . hydrochlorothiazide  12.5 mg Oral Daily  . levETIRAcetam  500 mg Intravenous Q12H  . multivitamin with minerals  1 tablet Oral Daily  . potassium chloride  20 mEq Oral Daily  . sodium chloride  3 mL Intravenous Q12H   Continuous Infusions:  PRN Meds:.acetaminophen, acetaminophen, ondansetron (ZOFRAN) IV, ondansetron, oxyCODONE  Prior to Admission medications   Medication Sig Start Date End Date Taking? Authorizing Provider  Aspirin-Salicylamide-Caffeine (BC HEADACHE POWDER PO) Take 2 packets by mouth every 8 (eight) hours as needed. Pain   Yes Historical Provider, MD  gabapentin (NEURONTIN) 300 MG capsule Take 300 mg by mouth 3 (three) times daily. 12/14/13  Yes Historical Provider, MD  hydrochlorothiazide (MICROZIDE) 12.5 MG capsule Take 12.5 mg by mouth daily. 12/14/13  Yes Historical Provider, MD  Multiple Vitamin (MULITIVITAMIN WITH MINERALS) TABS Take 1 tablet by mouth daily.   Yes Historical Provider, MD      ANALYSIS: A 16 channel recording using standard 10 20 measurements is conducted for 23 minutes. There is a well-formed posterior dominant rhythm of 9-1/2 Hz which attenuates with eye opening. Awake and sleep activities are observed. Photic simulation is carried out without abnormal changes in the background activity. There is beta activity observed in the frontal areas. There are no focal or lateralized slowing. There is no epileptiform activity observed.   IMPRESSION: 1. This is a normal recording of the awake and drowsy states. A single recording does not rule out epileptiform events. Repeat testing may be  warranted.      Audrey Bird, M.D.  Diplomate, Tax adviser of Psychiatry and Neurology ( Neurology).

## 2014-09-06 DIAGNOSIS — Z8249 Family history of ischemic heart disease and other diseases of the circulatory system: Secondary | ICD-10-CM | POA: Diagnosis not present

## 2014-09-06 DIAGNOSIS — M25551 Pain in right hip: Secondary | ICD-10-CM | POA: Diagnosis not present

## 2014-09-06 DIAGNOSIS — G8929 Other chronic pain: Secondary | ICD-10-CM | POA: Diagnosis not present

## 2014-09-06 DIAGNOSIS — I10 Essential (primary) hypertension: Secondary | ICD-10-CM | POA: Diagnosis not present

## 2014-09-18 DIAGNOSIS — M5431 Sciatica, right side: Secondary | ICD-10-CM | POA: Diagnosis not present

## 2014-09-18 DIAGNOSIS — I10 Essential (primary) hypertension: Secondary | ICD-10-CM | POA: Diagnosis not present

## 2014-10-11 IMAGING — CR DG CHEST 2V
2 series · 2 of 2 positions shown · non-contrast
Comparison: 03/18/2012 and 01/28/2011

CLINICAL DATA: Altered mental status.  Nausea and vomiting.

CHEST - 2 VIEW

[view not recorded (1 of 2)]
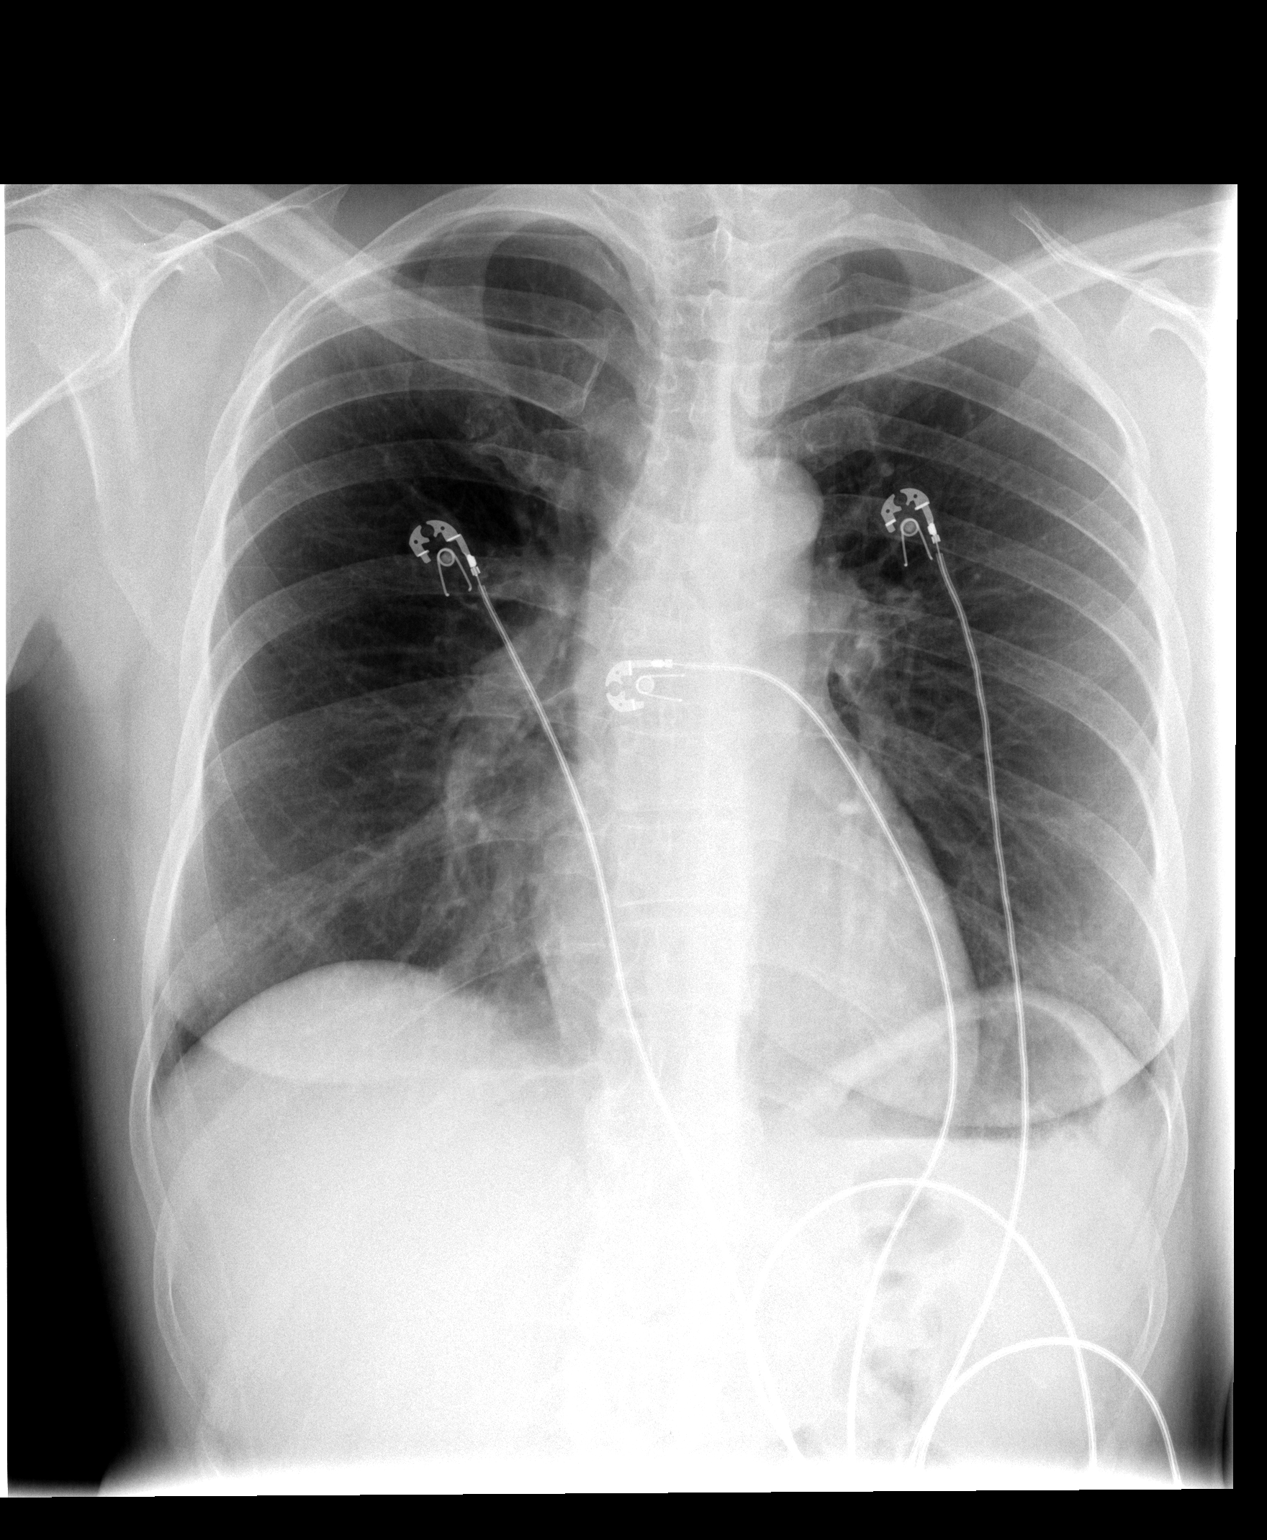

[view not recorded (2 of 2)]
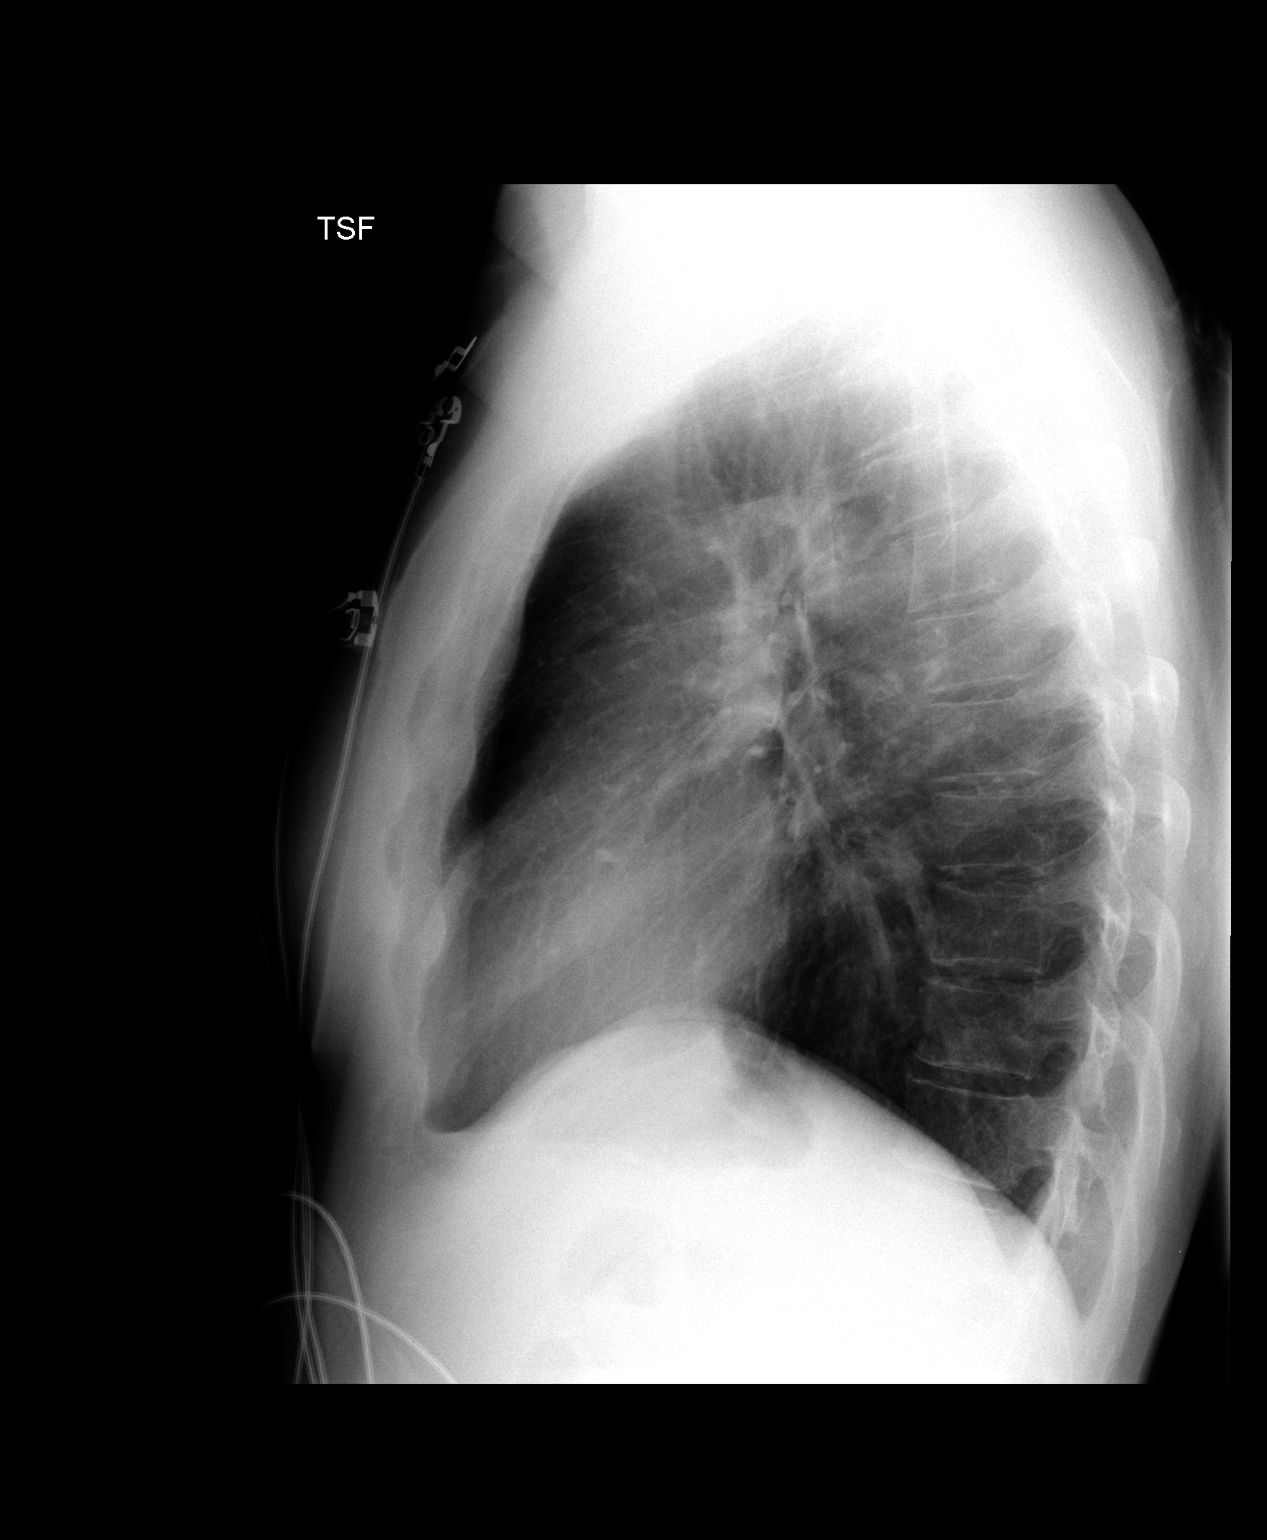

[2 of 2 positions shown; findings below may reference images not displayed]

FINDINGS: The heart size and pulmonary vascularity are normal and
the lungs are clear.  No osseous abnormality.  No effusions.
IMPRESSION: Normal chest.

## 2014-10-18 DIAGNOSIS — M5431 Sciatica, right side: Secondary | ICD-10-CM | POA: Diagnosis not present

## 2014-10-18 DIAGNOSIS — Z0000 Encounter for general adult medical examination without abnormal findings: Secondary | ICD-10-CM | POA: Diagnosis not present

## 2014-10-18 DIAGNOSIS — I10 Essential (primary) hypertension: Secondary | ICD-10-CM | POA: Diagnosis not present

## 2015-07-16 DIAGNOSIS — M169 Osteoarthritis of hip, unspecified: Secondary | ICD-10-CM | POA: Diagnosis not present

## 2015-07-16 DIAGNOSIS — I10 Essential (primary) hypertension: Secondary | ICD-10-CM | POA: Diagnosis not present

## 2015-07-16 DIAGNOSIS — Z0389 Encounter for observation for other suspected diseases and conditions ruled out: Secondary | ICD-10-CM | POA: Diagnosis not present

## 2015-07-16 DIAGNOSIS — R5383 Other fatigue: Secondary | ICD-10-CM | POA: Diagnosis not present

## 2015-07-16 DIAGNOSIS — R6882 Decreased libido: Secondary | ICD-10-CM | POA: Diagnosis not present

## 2015-07-16 DIAGNOSIS — Z23 Encounter for immunization: Secondary | ICD-10-CM | POA: Diagnosis not present

## 2015-07-16 DIAGNOSIS — N959 Unspecified menopausal and perimenopausal disorder: Secondary | ICD-10-CM | POA: Diagnosis not present

## 2015-07-16 DIAGNOSIS — Z1321 Encounter for screening for nutritional disorder: Secondary | ICD-10-CM | POA: Diagnosis not present

## 2015-07-18 DIAGNOSIS — R55 Syncope and collapse: Secondary | ICD-10-CM | POA: Diagnosis not present

## 2015-07-29 DIAGNOSIS — R5383 Other fatigue: Secondary | ICD-10-CM | POA: Diagnosis not present

## 2015-07-29 DIAGNOSIS — I10 Essential (primary) hypertension: Secondary | ICD-10-CM | POA: Diagnosis not present

## 2015-07-29 DIAGNOSIS — N959 Unspecified menopausal and perimenopausal disorder: Secondary | ICD-10-CM | POA: Diagnosis not present

## 2015-07-29 DIAGNOSIS — M169 Osteoarthritis of hip, unspecified: Secondary | ICD-10-CM | POA: Diagnosis not present

## 2015-08-19 DIAGNOSIS — R6882 Decreased libido: Secondary | ICD-10-CM | POA: Diagnosis not present

## 2015-08-19 DIAGNOSIS — M169 Osteoarthritis of hip, unspecified: Secondary | ICD-10-CM | POA: Diagnosis not present

## 2015-08-19 DIAGNOSIS — R55 Syncope and collapse: Secondary | ICD-10-CM | POA: Diagnosis not present

## 2015-08-19 DIAGNOSIS — E559 Vitamin D deficiency, unspecified: Secondary | ICD-10-CM | POA: Diagnosis not present

## 2015-08-19 DIAGNOSIS — Z23 Encounter for immunization: Secondary | ICD-10-CM | POA: Diagnosis not present

## 2015-08-29 ENCOUNTER — Ambulatory Visit: Payer: Medicare Other | Admitting: Orthopedic Surgery

## 2015-08-29 ENCOUNTER — Encounter: Payer: Self-pay | Admitting: *Deleted

## 2015-09-23 DIAGNOSIS — M169 Osteoarthritis of hip, unspecified: Secondary | ICD-10-CM | POA: Diagnosis not present

## 2015-09-23 DIAGNOSIS — E559 Vitamin D deficiency, unspecified: Secondary | ICD-10-CM | POA: Diagnosis not present

## 2015-09-23 DIAGNOSIS — N959 Unspecified menopausal and perimenopausal disorder: Secondary | ICD-10-CM | POA: Diagnosis not present

## 2015-09-23 DIAGNOSIS — R6882 Decreased libido: Secondary | ICD-10-CM | POA: Diagnosis not present

## 2015-10-09 DIAGNOSIS — M169 Osteoarthritis of hip, unspecified: Secondary | ICD-10-CM | POA: Diagnosis not present

## 2015-10-09 DIAGNOSIS — I10 Essential (primary) hypertension: Secondary | ICD-10-CM | POA: Diagnosis not present

## 2015-10-09 DIAGNOSIS — E559 Vitamin D deficiency, unspecified: Secondary | ICD-10-CM | POA: Diagnosis not present

## 2015-10-09 DIAGNOSIS — N959 Unspecified menopausal and perimenopausal disorder: Secondary | ICD-10-CM | POA: Diagnosis not present

## 2015-11-14 DIAGNOSIS — J019 Acute sinusitis, unspecified: Secondary | ICD-10-CM | POA: Diagnosis not present

## 2015-12-10 DIAGNOSIS — I10 Essential (primary) hypertension: Secondary | ICD-10-CM | POA: Diagnosis not present

## 2015-12-10 DIAGNOSIS — E559 Vitamin D deficiency, unspecified: Secondary | ICD-10-CM | POA: Diagnosis not present

## 2015-12-10 DIAGNOSIS — M169 Osteoarthritis of hip, unspecified: Secondary | ICD-10-CM | POA: Diagnosis not present

## 2016-03-04 DIAGNOSIS — R5383 Other fatigue: Secondary | ICD-10-CM | POA: Diagnosis not present

## 2016-03-04 DIAGNOSIS — N959 Unspecified menopausal and perimenopausal disorder: Secondary | ICD-10-CM | POA: Diagnosis not present

## 2016-03-04 DIAGNOSIS — E559 Vitamin D deficiency, unspecified: Secondary | ICD-10-CM | POA: Diagnosis not present

## 2016-03-04 DIAGNOSIS — I10 Essential (primary) hypertension: Secondary | ICD-10-CM | POA: Diagnosis not present

## 2016-03-04 DIAGNOSIS — M169 Osteoarthritis of hip, unspecified: Secondary | ICD-10-CM | POA: Diagnosis not present

## 2016-11-17 ENCOUNTER — Encounter: Payer: Self-pay | Admitting: Internal Medicine

## 2016-11-20 ENCOUNTER — Emergency Department (HOSPITAL_COMMUNITY)
Admission: EM | Admit: 2016-11-20 | Discharge: 2016-11-20 | Disposition: A | Discharge status: Home / Self Care | Payer: Medicare Other | Attending: Emergency Medicine | Admitting: Emergency Medicine

## 2016-11-20 ENCOUNTER — Encounter (HOSPITAL_COMMUNITY): Payer: Self-pay | Admitting: Emergency Medicine

## 2016-11-20 DIAGNOSIS — M25551 Pain in right hip: Secondary | ICD-10-CM | POA: Insufficient documentation

## 2016-11-20 DIAGNOSIS — Z79899 Other long term (current) drug therapy: Secondary | ICD-10-CM | POA: Insufficient documentation

## 2016-11-20 DIAGNOSIS — M5441 Lumbago with sciatica, right side: Secondary | ICD-10-CM | POA: Diagnosis not present

## 2016-11-20 DIAGNOSIS — I10 Essential (primary) hypertension: Secondary | ICD-10-CM | POA: Diagnosis not present

## 2016-11-20 DIAGNOSIS — G8929 Other chronic pain: Secondary | ICD-10-CM | POA: Insufficient documentation

## 2016-11-20 DIAGNOSIS — M545 Low back pain: Secondary | ICD-10-CM | POA: Diagnosis present

## 2016-11-20 DIAGNOSIS — Z7982 Long term (current) use of aspirin: Secondary | ICD-10-CM | POA: Diagnosis not present

## 2016-11-20 MED ORDER — KETOROLAC TROMETHAMINE 30 MG/ML IJ SOLN
30.0000 mg | Freq: Once | INTRAMUSCULAR | Status: AC
  Administered 2016-11-20: 30 mg via INTRAMUSCULAR
  Filled 2016-11-20: qty 1

## 2016-11-20 MED ORDER — HYDROCODONE-ACETAMINOPHEN 5-325 MG PO TABS: 1.0000 | ORAL_TABLET | ORAL | 0 refills | Status: DC | PRN

## 2016-11-20 MED ORDER — PREDNISONE 10 MG PO TABS: ORAL_TABLET | ORAL | 0 refills | Status: DC

## 2016-11-20 MED ORDER — HYDROCODONE-ACETAMINOPHEN 5-325 MG PO TABS
2.0000 | ORAL_TABLET | Freq: Once | ORAL | Status: AC
Start: 2016-11-20 — End: 2016-11-20
  Administered 2016-11-20: 2 via ORAL
  Filled 2016-11-20: qty 2

## 2016-11-20 MED ORDER — DEXAMETHASONE SODIUM PHOSPHATE 10 MG/ML IJ SOLN
10.0000 mg | Freq: Once | INTRAMUSCULAR | Status: AC
  Administered 2016-11-20: 10 mg via INTRAMUSCULAR
  Filled 2016-11-20: qty 1

## 2016-11-20 MED ORDER — CYCLOBENZAPRINE HCL 5 MG PO TABS: 5.0000 mg | ORAL_TABLET | Freq: Three times a day (TID) | ORAL | 0 refills | Status: DC | PRN

## 2016-11-20 NOTE — Discharge Instructions (Signed)
You have no emergent exam findings suggesting your need surgery or admission today which is good.  You will definitely benefit from pain management so it is good your doctor is working on this for you.  In the interim, try the medicines prescribed.  Take your next dose of prednisone on Sunday, since the steroid shot you received today is long lasting.  You may take the hydrocodone prescribed for pain relief.  This will make you drowsy - do not drive within 4 hours of taking this medication.  You may also benefit from a heating pad applied to your area of pain for 20 minutes 3-4 times daily.

## 2016-11-20 NOTE — ED Triage Notes (Signed)
PT c/o lower back pain that radiates to right hip/leg worsening over the past 3 days with no new injury. PT states her PCP is setting her up with a pain management doctor.
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
## 2016-11-25 ENCOUNTER — Emergency Department (HOSPITAL_COMMUNITY): Payer: Medicare Other

## 2016-11-25 ENCOUNTER — Encounter (HOSPITAL_COMMUNITY): Payer: Self-pay | Admitting: Emergency Medicine

## 2016-11-25 ENCOUNTER — Emergency Department (HOSPITAL_COMMUNITY)
Admission: EM | Admit: 2016-11-25 | Discharge: 2016-11-25 | Disposition: A | Discharge status: Home / Self Care | Payer: Medicare Other | Attending: Emergency Medicine | Admitting: Emergency Medicine

## 2016-11-25 DIAGNOSIS — M5441 Lumbago with sciatica, right side: Secondary | ICD-10-CM | POA: Insufficient documentation

## 2016-11-25 DIAGNOSIS — M5431 Sciatica, right side: Secondary | ICD-10-CM

## 2016-11-25 DIAGNOSIS — R52 Pain, unspecified: Secondary | ICD-10-CM

## 2016-11-25 DIAGNOSIS — Z79899 Other long term (current) drug therapy: Secondary | ICD-10-CM | POA: Diagnosis not present

## 2016-11-25 DIAGNOSIS — I10 Essential (primary) hypertension: Secondary | ICD-10-CM | POA: Diagnosis not present

## 2016-11-25 DIAGNOSIS — M25551 Pain in right hip: Secondary | ICD-10-CM | POA: Diagnosis present

## 2016-11-25 DIAGNOSIS — M545 Low back pain: Secondary | ICD-10-CM | POA: Diagnosis not present

## 2016-11-25 LAB — PREGNANCY, URINE: Preg Test, Ur: NEGATIVE

## 2016-11-25 MED ORDER — HYDROCODONE-ACETAMINOPHEN 5-325 MG PO TABS: 1.0000 | ORAL_TABLET | Freq: Four times a day (QID) | ORAL | 0 refills | Status: DC | PRN

## 2016-11-25 MED ORDER — NAPROXEN 500 MG PO TABS: 500.0000 mg | ORAL_TABLET | Freq: Two times a day (BID) | ORAL | 0 refills | Status: DC

## 2016-11-25 MED ORDER — ONDANSETRON HCL 4 MG/2ML IJ SOLN
4.0000 mg | Freq: Once | INTRAMUSCULAR | Status: AC
  Administered 2016-11-25: 4 mg via INTRAVENOUS
  Filled 2016-11-25: qty 2

## 2016-11-25 MED ORDER — HYDROMORPHONE HCL 1 MG/ML IJ SOLN
1.0000 mg | Freq: Once | INTRAMUSCULAR | Status: AC
  Administered 2016-11-25: 1 mg via INTRAVENOUS
  Filled 2016-11-25: qty 1
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
## 2016-11-25 NOTE — ED Triage Notes (Addendum)
Pt c/o right hip pain that radiates down the right leg. Pt states she went to see pcp today but he was not in the office. Pt states she was seen here on the 9th for the same. Pt states she can hardly get around at home due to hip pain and unable to bear weight.

## 2016-11-25 NOTE — Discharge Instructions (Signed)
Follow up with your spine doctor as planned, take the medications as needed to help with the pain and discomfort

## 2016-11-26 ENCOUNTER — Ambulatory Visit (HOSPITAL_COMMUNITY)
Admission: RE | Admit: 2016-11-26 | Discharge: 2016-11-26 | Disposition: A | Discharge status: Home / Self Care | Payer: Medicare Other | Source: Ambulatory Visit | Attending: Internal Medicine | Admitting: Internal Medicine

## 2016-11-26 ENCOUNTER — Other Ambulatory Visit (HOSPITAL_COMMUNITY): Payer: Self-pay | Admitting: Internal Medicine

## 2016-11-26 DIAGNOSIS — M5442 Lumbago with sciatica, left side: Principal | ICD-10-CM

## 2016-11-26 DIAGNOSIS — M5441 Lumbago with sciatica, right side: Secondary | ICD-10-CM

## 2016-11-26 DIAGNOSIS — M47896 Other spondylosis, lumbar region: Secondary | ICD-10-CM | POA: Insufficient documentation

## 2016-11-26 DIAGNOSIS — M545 Low back pain: Secondary | ICD-10-CM | POA: Diagnosis not present

## 2016-11-26 DIAGNOSIS — M169 Osteoarthritis of hip, unspecified: Secondary | ICD-10-CM | POA: Diagnosis not present

## 2016-11-26 DIAGNOSIS — M25551 Pain in right hip: Secondary | ICD-10-CM | POA: Diagnosis not present

## 2016-11-26 DIAGNOSIS — I10 Essential (primary) hypertension: Secondary | ICD-10-CM | POA: Diagnosis not present

## 2016-11-26 DIAGNOSIS — M5126 Other intervertebral disc displacement, lumbar region: Secondary | ICD-10-CM | POA: Diagnosis not present

## 2016-11-30 ENCOUNTER — Ambulatory Visit (HOSPITAL_COMMUNITY)
Admission: RE | Admit: 2016-11-30 | Discharge: 2016-11-30 | Disposition: A | Discharge status: Home / Self Care | Payer: Medicare Other | Source: Ambulatory Visit | Attending: Internal Medicine | Admitting: Internal Medicine

## 2016-11-30 ENCOUNTER — Other Ambulatory Visit (HOSPITAL_COMMUNITY): Payer: Self-pay | Admitting: Internal Medicine

## 2016-11-30 DIAGNOSIS — R1031 Right lower quadrant pain: Secondary | ICD-10-CM

## 2016-11-30 DIAGNOSIS — J90 Pleural effusion, not elsewhere classified: Secondary | ICD-10-CM | POA: Insufficient documentation

## 2016-11-30 DIAGNOSIS — I313 Pericardial effusion (noninflammatory): Secondary | ICD-10-CM | POA: Insufficient documentation

## 2016-11-30 DIAGNOSIS — R1900 Intra-abdominal and pelvic swelling, mass and lump, unspecified site: Secondary | ICD-10-CM | POA: Insufficient documentation

## 2016-11-30 MED ORDER — IOPAMIDOL (ISOVUE-300) INJECTION 61%
INTRAVENOUS | Status: AC
  Administered 2016-11-30: 30 mL
  Filled 2016-11-30: qty 30

## 2016-11-30 MED ORDER — IOPAMIDOL (ISOVUE-300) INJECTION 61%
100.0000 mL | Freq: Once | INTRAVENOUS | Status: AC | PRN
  Administered 2016-11-30: 100 mL via INTRAVENOUS

## 2016-12-01 DIAGNOSIS — M25551 Pain in right hip: Secondary | ICD-10-CM | POA: Diagnosis not present

## 2017-01-19 ENCOUNTER — Ambulatory Visit (INDEPENDENT_AMBULATORY_CARE_PROVIDER_SITE_OTHER): Payer: Medicare Other

## 2017-01-19 ENCOUNTER — Encounter: Payer: Self-pay | Admitting: Orthopaedic Surgery

## 2017-01-19 ENCOUNTER — Ambulatory Visit (INDEPENDENT_AMBULATORY_CARE_PROVIDER_SITE_OTHER): Payer: Medicare Other | Admitting: Orthopaedic Surgery

## 2017-01-19 VITALS — BP 160/94 | HR 102 | Temp 97.9°F | Ht 66.0 in | Wt 137.0 lb

## 2017-01-19 DIAGNOSIS — G8929 Other chronic pain: Secondary | ICD-10-CM

## 2017-01-19 DIAGNOSIS — M5441 Lumbago with sciatica, right side: Secondary | ICD-10-CM | POA: Diagnosis not present

## 2017-01-19 NOTE — Progress Notes (Signed)
Subjective:    Patient ID: Audrey Bird, female    DOB: January 11, 1961, 56 y.o.   MRN: [id number]  HPI She has a long 20 year history of lower back pain.  She has SSI disability because of this.  She has had right lower leg pain with radiation from the hip on the right for several weeks now. She has no trauma.  She has no weakness, no bowel or bladder problems.  She was seen by Dr. Anastasio Champion for this and he got CT of the abdomen and MRI of the right hip.  Findings were negative for right hip findings.  She continues to have pain.  She takes six to eight BC powders a day for this.  She has used ice and heat with no help.    I have reviewed the MRI of the right hip and reports.  I have reviewed Dr. Lanice Shirts notes.    I will schedule her for a MRI of the lumbar spine to rule out HNP.   Review of Systems  HENT: Negative for congestion.   Respiratory: Negative for cough and shortness of breath.   Cardiovascular: Negative for chest pain and leg swelling.  Endocrine: Positive for cold intolerance.  Musculoskeletal: Positive for arthralgias and back pain.  Allergic/Immunologic: Positive for environmental allergies.  Psychiatric/Behavioral: The patient is nervous/anxious.    Past Medical History:  Diagnosis Date  . Anxiety   . Arthritis   . Fracture of pelvis    history of  . Hip pain, right    chronic  . History of stomach ulcers 2011   Overuse of NSAIDS  . Hyperlipidemia   . Hypertension   . Lumbago    history of  . S/P cardiac cath 04/07/10   mild 3 vessel disease, cornary spasm     Past Surgical History:  Procedure Laterality Date  . COLONOSCOPY  06/22/2012   Procedure: COLONOSCOPY;  Surgeon: Rogene Houston, MD;  Location: [facility];  Service: Endoscopy;  Laterality: N/A;  100  . CYSTECTOMY     hand  . HAND SURGERY    . HEMORRHOID SURGERY      Current Outpatient Prescriptions on File Prior to Visit  Medication Sig Dispense Refill  . Multiple Vitamin (MULITIVITAMIN  WITH MINERALS) TABS Take 1 tablet by mouth daily.    . Aspirin-Salicylamide-Caffeine (BC HEADACHE POWDER PO) Take 2 packets by mouth every 8 (eight) hours as needed. Pain    . cyclobenzaprine (FLEXERIL) 5 MG tablet Take 1 tablet (5 mg total) by mouth 3 (three) times daily as needed for muscle spasms. (Patient not taking: Reported on 01/19/2017) 15 tablet 0  . HYDROcodone-acetaminophen (NORCO/VICODIN) 5-325 MG tablet Take 1 tablet by mouth every 6 (six) hours as needed. (Patient not taking: Reported on 01/19/2017) 20 tablet 0  . naproxen (NAPROSYN) 500 MG tablet Take 1 tablet (500 mg total) by mouth 2 (two) times daily. (Patient not taking: Reported on 01/19/2017) 30 tablet 0  . predniSONE (DELTASONE) 10 MG tablet Take 6 tablets day one, 5 tablets day two, 4 tablets day three, 3 tablets day four, 2 tablets day five, then 1 tablet day six (Patient not taking: Reported on 01/19/2017) 21 tablet 0   No current facility-administered medications on file prior to visit.     Social History   Social History  . Marital status: Widowed    Spouse name: N/A  . Number of children: N/A  . Years of education: N/A   Occupational History  .  Not on file.   Social History Main Topics  . Smoking status: Never Smoker  . Smokeless tobacco: Never Used  . Alcohol use No  . Drug use: No  . Sexual activity: Not on file   Other Topics Concern  . Not on file   Social History Narrative  . No narrative on file    Family History  Problem Relation Age of Onset  . Cancer Father     skin  . Aneurysm Father     brain  . Cancer Mother     thyroid     BP (!) 160/94   Pulse (!) 102   Temp 97.9 F (36.6 C)   Ht 5\' 6"  (1.676 m)   Wt 137 lb (62.1 kg)   BMI 22.11 kg/m      Objective:   Physical Exam  Constitutional: She is oriented to person, place, and time. She appears well-developed and well-nourished.  HENT:  Head: Normocephalic and atraumatic.  Eyes: Conjunctivae and EOM are normal. Pupils are  equal, round, and reactive to light.  Neck: Normal range of motion. Neck supple.  Cardiovascular: Normal rate, regular rhythm and intact distal pulses.   Pulmonary/Chest: Effort normal.  Abdominal: Soft.  Musculoskeletal: She exhibits tenderness (lower back not tender, flexes 45 with pain, lateral bend normal, heel/toe normal, reflexes normal, ROM of the right hip is full.  Pulses normal.).  Neurological: She is alert and oriented to person, place, and time. She displays normal reflexes. No cranial nerve deficit. She exhibits normal muscle tone. Coordination normal.  Skin: Skin is warm and dry.  Psychiatric: She has a normal mood and affect. Her behavior is normal. Judgment and thought content normal.  Vitals reviewed.  X-rays of the lumbar spine were done and reported separately.       Assessment & Plan:   Encounter Diagnosis  Name Primary?  . Chronic right-sided low back pain with right-sided sciatica Yes   I have scheduled a MRI of the lumbar spine.  Return after MRI.  Call if any problem.  Precautions discussed.  Electronically Signed Sanjuana Kava, MD 4/10/20182:26 PM

## 2017-01-21 ENCOUNTER — Ambulatory Visit (HOSPITAL_COMMUNITY)
Admission: RE | Admit: 2017-01-21 | Discharge: 2017-01-21 | Disposition: A | Discharge status: Home / Self Care | Payer: Medicare Other | Source: Ambulatory Visit | Attending: Orthopaedic Surgery | Admitting: Orthopaedic Surgery

## 2017-01-21 DIAGNOSIS — M48061 Spinal stenosis, lumbar region without neurogenic claudication: Secondary | ICD-10-CM | POA: Diagnosis not present

## 2017-01-21 DIAGNOSIS — M545 Low back pain: Secondary | ICD-10-CM | POA: Diagnosis not present

## 2017-01-21 DIAGNOSIS — G8929 Other chronic pain: Secondary | ICD-10-CM | POA: Diagnosis not present

## 2017-01-21 DIAGNOSIS — M5441 Lumbago with sciatica, right side: Secondary | ICD-10-CM | POA: Diagnosis not present

## 2017-01-21 DIAGNOSIS — M47896 Other spondylosis, lumbar region: Secondary | ICD-10-CM | POA: Diagnosis not present

## 2017-01-21 DIAGNOSIS — M5126 Other intervertebral disc displacement, lumbar region: Secondary | ICD-10-CM | POA: Insufficient documentation

## 2017-01-26 ENCOUNTER — Encounter: Payer: Self-pay | Admitting: Orthopaedic Surgery

## 2017-01-26 ENCOUNTER — Ambulatory Visit (INDEPENDENT_AMBULATORY_CARE_PROVIDER_SITE_OTHER): Payer: Medicare Other | Admitting: Orthopaedic Surgery

## 2017-01-26 VITALS — BP 182/105 | HR 95 | Temp 98.1°F | Ht 66.0 in | Wt 137.0 lb

## 2017-01-26 DIAGNOSIS — G8929 Other chronic pain: Secondary | ICD-10-CM | POA: Diagnosis not present

## 2017-01-26 DIAGNOSIS — M5441 Lumbago with sciatica, right side: Secondary | ICD-10-CM

## 2017-01-26 NOTE — Progress Notes (Signed)
Patient Audrey Bird, female DOB:[birth year], 56 y.o. NKN:[id number]  Chief Complaint  Patient presents with  . Results    MRI L Spine    HPI  Audrey Bird is a 56 y.o. female who has right sided sciatica that is getting worse.  She had recent MRI which shows: IMPRESSION: 1. Right foraminal to extraforaminal disc protrusion at L4-5 with right neural foraminal stenosis and L4 nerve impingement. 2. Mild disc bulging and facet arthrosis at L3-4 without evidence of neural impingement.  I have explained the findings to her.  I recommend she see neurosurgeon and appointment will be made. HPI  Body mass index is 22.11 kg/m.  ROS  Review of Systems  HENT: Negative for congestion.   Respiratory: Negative for cough and shortness of breath.   Cardiovascular: Negative for chest pain and leg swelling.  Endocrine: Positive for cold intolerance.  Musculoskeletal: Positive for arthralgias and back pain.  Allergic/Immunologic: Positive for environmental allergies.  Psychiatric/Behavioral: The patient is nervous/anxious.     Past Medical History:  Diagnosis Date  . Anxiety   . Arthritis   . Fracture of pelvis    history of  . Hip pain, right    chronic  . History of stomach ulcers 2011   Overuse of NSAIDS  . Hyperlipidemia   . Hypertension   . Lumbago    history of  . S/P cardiac cath 04/07/10   mild 3 vessel disease, cornary spasm     Past Surgical History:  Procedure Laterality Date  . COLONOSCOPY  06/22/2012   Procedure: COLONOSCOPY;  Surgeon: Rogene Houston, MD;  Location: [facility];  Service: Endoscopy;  Laterality: N/A;  100  . CYSTECTOMY     hand  . HAND SURGERY    . HEMORRHOID SURGERY      Family History  Problem Relation Age of Onset  . Cancer Father     skin  . Aneurysm Father     brain  . Cancer Mother     thyroid     Social History Social History  Substance Use Topics  . Smoking status: Never Smoker  . Smokeless tobacco: Never Used  .  Alcohol use No    No Known Allergies  Current Outpatient Prescriptions  Medication Sig Dispense Refill  . Aspirin-Salicylamide-Caffeine (BC HEADACHE POWDER PO) Take 2 packets by mouth every 8 (eight) hours as needed. Pain    . Cholecalciferol (VITAMIN D-3) 1000 units CAPS Take by mouth.    . Cholecalciferol (VITAMIN D3) 5000 units CAPS Take by mouth.    . cyclobenzaprine (FLEXERIL) 5 MG tablet Take 1 tablet (5 mg total) by mouth 3 (three) times daily as needed for muscle spasms. (Patient not taking: Reported on 01/19/2017) 15 tablet 0  . estradiol (CLIMARA - DOSED IN MG/24 HR) 0.025 mg/24hr patch     . HYDROcodone-acetaminophen (NORCO/VICODIN) 5-325 MG tablet Take 1 tablet by mouth every 6 (six) hours as needed. (Patient not taking: Reported on 01/19/2017) 20 tablet 0  . metoprolol tartrate (LOPRESSOR) 25 MG tablet     . Multiple Vitamin (MULITIVITAMIN WITH MINERALS) TABS Take 1 tablet by mouth daily.    . naproxen (NAPROSYN) 500 MG tablet Take 1 tablet (500 mg total) by mouth 2 (two) times daily. (Patient not taking: Reported on 01/19/2017) 30 tablet 0  . predniSONE (DELTASONE) 10 MG tablet Take 6 tablets day one, 5 tablets day two, 4 tablets day three, 3 tablets day four, 2 tablets day five, then 1 tablet  day six (Patient not taking: Reported on 01/19/2017) 21 tablet 0  . progesterone (PROMETRIUM) 100 MG capsule      No current facility-administered medications for this visit.      Physical Exam  Blood pressure (!) 182/105, pulse 95, temperature 98.1 F (36.7 C), height 5\' 6"  (1.676 m), weight 137 lb (62.1 kg).  Constitutional: overall normal hygiene, normal nutrition, well developed, normal grooming, normal body habitus. Assistive device:none  Musculoskeletal: gait and station Limp right, muscle tone and strength are normal, no tremors or atrophy is present.  .  Neurological: coordination overall normal.  Deep tendon reflex/nerve stretch intact.  Sensation normal.  Cranial nerves  II-XII intact.   Skin:   Normal overall no scars, lesions, ulcers or rashes. No psoriasis.  Psychiatric: Alert and oriented x 3.  Recent memory intact, remote memory unclear.  Normal mood and affect. Well groomed.  Good eye contact.  Cardiovascular: overall no swelling, no varicosities, no edema bilaterally, normal temperatures of the legs and arms, no clubbing, cyanosis and good capillary refill.  Lymphatic: palpation is normal.  Spine/Pelvis examination:  Inspection:  Overall, sacoiliac joint benign and hips nontender; without crepitus or defects.   Thoracic spine inspection: Alignment normal without kyphosis present   Lumbar spine inspection:  Alignment  with normal lumbar lordosis, without scoliosis apparent.   Thoracic spine palpation:  without tenderness of spinal processes   Lumbar spine palpation: with tenderness of lumbar area; without tightness of lumbar muscles    Range of Motion:   Lumbar flexion, forward flexion is 25 with pain or tenderness    Lumbar extension is 5 with pain or tenderness   Left lateral bend is Normal  without pain or tenderness   Right lateral bend is Normal without pain or tenderness   Straight leg raising is Abnormal- 20 degrees on the right and very painful   Strength & tone: Normal   Stability overall normal stability     The patient has been educated about the nature of the problem(s) and counseled on treatment options.  The patient appeared to understand what I have discussed and is in agreement with it.  Encounter Diagnosis  Name Primary?  . Chronic right-sided low back pain with right-sided sciatica Yes    PLAN Call if any problems.  Precautions discussed.  Continue current medications.   Return to clinic to neurosurgeon   Electronically Signed Sanjuana Kava, MD 4/17/201810:38 AM

## 2017-02-04 ENCOUNTER — Other Ambulatory Visit: Payer: Self-pay | Admitting: Neurosurgery

## 2017-02-04 DIAGNOSIS — M5126 Other intervertebral disc displacement, lumbar region: Secondary | ICD-10-CM | POA: Diagnosis not present

## 2017-02-08 NOTE — Pre-Procedure Instructions (Signed)
    ARRIYANA RODELL  02/08/2017      [place], [place] [address] Phone: [phone] Fax: [phone]  [place], [place], [place] [address] [place] [address] Phone: [phone] Fax: [phone]    Your procedure is scheduled on Wednesday, May 9th   Report to [facility] Admitting at 6:30 AM             (posted surgery time 8:30 - 10:03 AM)   Call this number if you have problems the morning of surgery:  [phone], or for any other questions [phone] Mon - Fri  from 8-4:30pm   Remember:  Do not eat food or drink liquids after midnight Tuesday.   Take these medicines the morning of surgery with A SIP OF WATER : Gabapentin, Metoprolol.               4-5 days prior to surgery, STOP TAKING any Vitamins, (multi vitamins, vitamin D) , Herbal Supplements, (fish oil) , Anti-inflammatories (advil, aleve, naprosyn, ibuprofen)   Do not wear jewelry, make-up or nail polish.  Do not wear lotions, powders,  perfumes, or deoderant.  Do not shave underarms & legs 48 hours prior to surgery.     Do not bring valuables to the hospital.  [facility] is not responsible for any belongings or valuables.  Contacts, dentures or bridgework may not be worn into surgery.  Leave your suitcase in the car.  After surgery it may be brought to your room.  For patients admitted to the hospital, discharge time will be determined by your treatment team.   Please read over the following fact sheets that you were given. Pain Booklet, MRSA Information and Surgical Site Infection Prevention

## 2017-02-09 ENCOUNTER — Encounter (HOSPITAL_COMMUNITY): Payer: Self-pay | Admitting: *Deleted

## 2017-02-09 ENCOUNTER — Encounter (HOSPITAL_COMMUNITY)
Admission: RE | Admit: 2017-02-09 | Discharge: 2017-02-09 | Disposition: A | Discharge status: Home / Self Care | Payer: Medicare Other | Source: Ambulatory Visit | Attending: Neurosurgery | Admitting: Neurosurgery

## 2017-02-09 DIAGNOSIS — M5126 Other intervertebral disc displacement, lumbar region: Secondary | ICD-10-CM | POA: Diagnosis not present

## 2017-02-09 DIAGNOSIS — I10 Essential (primary) hypertension: Secondary | ICD-10-CM | POA: Insufficient documentation

## 2017-02-09 DIAGNOSIS — Z01812 Encounter for preprocedural laboratory examination: Secondary | ICD-10-CM | POA: Insufficient documentation

## 2017-02-09 DIAGNOSIS — R9431 Abnormal electrocardiogram [ECG] [EKG]: Secondary | ICD-10-CM | POA: Insufficient documentation

## 2017-02-09 DIAGNOSIS — Z01818 Encounter for other preprocedural examination: Secondary | ICD-10-CM | POA: Diagnosis present

## 2017-02-09 DIAGNOSIS — Z01810 Encounter for preprocedural cardiovascular examination: Secondary | ICD-10-CM | POA: Insufficient documentation

## 2017-02-09 HISTORY — DX: Headache, unspecified: R51.9

## 2017-02-09 HISTORY — DX: Headache: R51

## 2017-02-09 HISTORY — DX: Cardiac murmur, unspecified: R01.1

## 2017-02-09 LAB — CBC WITH DIFFERENTIAL/PLATELET
BASOS ABS: 0 10*3/uL (ref 0.0–0.1)
BASOS PCT: 0 %
EOS ABS: 0 10*3/uL (ref 0.0–0.7)
EOS PCT: 1 %
HCT: 37.6 % (ref 36.0–46.0)
Hemoglobin: 12.4 g/dL (ref 12.0–15.0)
LYMPHS ABS: 1.9 10*3/uL (ref 0.7–4.0)
Lymphocytes Relative: 42 %
MCH: 29.4 pg (ref 26.0–34.0)
MCHC: 33 g/dL (ref 30.0–36.0)
MCV: 89.1 fL (ref 78.0–100.0)
Monocytes Absolute: 0.3 10*3/uL (ref 0.1–1.0)
Monocytes Relative: 7 %
Neutro Abs: 2.2 10*3/uL (ref 1.7–7.7)
Neutrophils Relative %: 50 %
PLATELETS: 222 10*3/uL (ref 150–400)
RBC: 4.22 MIL/uL (ref 3.87–5.11)
RDW: 13.3 % (ref 11.5–15.5)
WBC: 4.5 10*3/uL (ref 4.0–10.5)

## 2017-02-09 LAB — BASIC METABOLIC PANEL
Anion gap: 6 (ref 5–15)
BUN: 13 mg/dL (ref 6–20)
CHLORIDE: 109 mmol/L (ref 101–111)
CO2: 29 mmol/L (ref 22–32)
CREATININE: 0.7 mg/dL (ref 0.44–1.00)
Calcium: 9.3 mg/dL (ref 8.9–10.3)
Glucose, Bld: 66 mg/dL (ref 65–99)
Potassium: 3.8 mmol/L (ref 3.5–5.1)
SODIUM: 144 mmol/L (ref 135–145)

## 2017-02-09 LAB — SURGICAL PCR SCREEN
MRSA, PCR: NEGATIVE
STAPHYLOCOCCUS AUREUS: NEGATIVE

## 2017-02-17 ENCOUNTER — Ambulatory Visit (HOSPITAL_COMMUNITY): Payer: Medicare Other

## 2017-02-17 ENCOUNTER — Encounter (HOSPITAL_COMMUNITY): Payer: Self-pay | Admitting: Certified Registered Nurse Anesthetist

## 2017-02-17 ENCOUNTER — Ambulatory Visit (HOSPITAL_COMMUNITY): Payer: Medicare Other | Admitting: Anesthesiology

## 2017-02-17 ENCOUNTER — Observation Stay (HOSPITAL_COMMUNITY)
Admission: RE | Admit: 2017-02-17 | Discharge: 2017-02-18 | Disposition: A | Discharge status: Home / Self Care | Payer: Medicare Other | Source: Ambulatory Visit | Attending: Neurosurgery | Admitting: Neurosurgery

## 2017-02-17 ENCOUNTER — Encounter (HOSPITAL_COMMUNITY)
Admission: RE | Disposition: A | Discharge status: Home / Self Care | Payer: Self-pay | Source: Ambulatory Visit | Attending: Neurosurgery

## 2017-02-17 DIAGNOSIS — I10 Essential (primary) hypertension: Secondary | ICD-10-CM | POA: Diagnosis not present

## 2017-02-17 DIAGNOSIS — Z8719 Personal history of other diseases of the digestive system: Secondary | ICD-10-CM | POA: Diagnosis not present

## 2017-02-17 DIAGNOSIS — M1990 Unspecified osteoarthritis, unspecified site: Secondary | ICD-10-CM | POA: Diagnosis not present

## 2017-02-17 DIAGNOSIS — E785 Hyperlipidemia, unspecified: Secondary | ICD-10-CM | POA: Diagnosis not present

## 2017-02-17 DIAGNOSIS — Z7982 Long term (current) use of aspirin: Secondary | ICD-10-CM | POA: Insufficient documentation

## 2017-02-17 DIAGNOSIS — Z808 Family history of malignant neoplasm of other organs or systems: Secondary | ICD-10-CM | POA: Diagnosis not present

## 2017-02-17 DIAGNOSIS — M79604 Pain in right leg: Secondary | ICD-10-CM | POA: Insufficient documentation

## 2017-02-17 DIAGNOSIS — F419 Anxiety disorder, unspecified: Secondary | ICD-10-CM | POA: Insufficient documentation

## 2017-02-17 DIAGNOSIS — F418 Other specified anxiety disorders: Secondary | ICD-10-CM | POA: Diagnosis not present

## 2017-02-17 DIAGNOSIS — Z9889 Other specified postprocedural states: Secondary | ICD-10-CM | POA: Diagnosis not present

## 2017-02-17 DIAGNOSIS — Z8249 Family history of ischemic heart disease and other diseases of the circulatory system: Secondary | ICD-10-CM | POA: Diagnosis not present

## 2017-02-17 DIAGNOSIS — Z79899 Other long term (current) drug therapy: Secondary | ICD-10-CM | POA: Diagnosis not present

## 2017-02-17 DIAGNOSIS — M5116 Intervertebral disc disorders with radiculopathy, lumbar region: Principal | ICD-10-CM | POA: Insufficient documentation

## 2017-02-17 DIAGNOSIS — R011 Cardiac murmur, unspecified: Secondary | ICD-10-CM | POA: Insufficient documentation

## 2017-02-17 DIAGNOSIS — M5126 Other intervertebral disc displacement, lumbar region: Secondary | ICD-10-CM | POA: Diagnosis not present

## 2017-02-17 DIAGNOSIS — Z419 Encounter for procedure for purposes other than remedying health state, unspecified: Secondary | ICD-10-CM

## 2017-02-17 HISTORY — PX: LUMBAR LAMINECTOMY/DECOMPRESSION MICRODISCECTOMY: SHX5026

## 2017-02-17 SURGERY — LUMBAR LAMINECTOMY/DECOMPRESSION MICRODISCECTOMY 1 LEVEL
Anesthesia: General | Site: Spine Lumbar | Laterality: Right

## 2017-02-17 MED ORDER — CEFAZOLIN SODIUM-DEXTROSE 1-4 GM/50ML-% IV SOLN
1.0000 g | Freq: Three times a day (TID) | INTRAVENOUS | Status: AC
  Administered 2017-02-17 (×2): 1 g via INTRAVENOUS
  Filled 2017-02-17 (×2): qty 50

## 2017-02-17 MED ORDER — PROPOFOL 10 MG/ML IV BOLUS
INTRAVENOUS | Status: DC | PRN
  Administered 2017-02-17: 150 mg via INTRAVENOUS

## 2017-02-17 MED ORDER — DEXAMETHASONE SODIUM PHOSPHATE 10 MG/ML IJ SOLN
10.0000 mg | INTRAMUSCULAR | Status: AC
  Administered 2017-02-17: 10 mg via INTRAVENOUS
  Filled 2017-02-17: qty 1

## 2017-02-17 MED ORDER — HYDROMORPHONE HCL 1 MG/ML IJ SOLN: 0.5000 mg | INTRAMUSCULAR | Status: DC | PRN

## 2017-02-17 MED ORDER — METOPROLOL TARTRATE 25 MG PO TABS
25.0000 mg | ORAL_TABLET | Freq: Two times a day (BID) | ORAL | Status: DC
Start: 2017-02-17 — End: 2017-02-18
  Administered 2017-02-17: 25 mg via ORAL
  Filled 2017-02-17: qty 1

## 2017-02-17 MED ORDER — ONDANSETRON HCL 4 MG/2ML IJ SOLN: 4.0000 mg | Freq: Once | INTRAMUSCULAR | Status: DC | PRN

## 2017-02-17 MED ORDER — FENTANYL CITRATE (PF) 100 MCG/2ML IJ SOLN
INTRAMUSCULAR | Status: DC | PRN
  Administered 2017-02-17: 50 ug via INTRAVENOUS
  Administered 2017-02-17: 100 ug via INTRAVENOUS
  Administered 2017-02-17 (×2): 50 ug via INTRAVENOUS

## 2017-02-17 MED ORDER — BUPIVACAINE HCL (PF) 0.25 % IJ SOLN
INTRAMUSCULAR | Status: DC | PRN
  Administered 2017-02-17: 20 mL

## 2017-02-17 MED ORDER — VITAMIN D 1000 UNITS PO TABS
4000.0000 [IU] | ORAL_TABLET | Freq: Every day | ORAL | Status: DC
Start: 2017-02-17 — End: 2017-02-18

## 2017-02-17 MED ORDER — GLYCOPYRROLATE 0.2 MG/ML IV SOSY
PREFILLED_SYRINGE | INTRAVENOUS | Status: DC | PRN
  Administered 2017-02-17: 0.4 mg via INTRAVENOUS

## 2017-02-17 MED ORDER — HYDROMORPHONE HCL 1 MG/ML IJ SOLN
0.5000 mg | INTRAMUSCULAR | Status: DC | PRN
  Administered 2017-02-17 (×2): 1 mg via INTRAVENOUS
  Filled 2017-02-17 (×2): qty 1

## 2017-02-17 MED ORDER — HYDROMORPHONE HCL 1 MG/ML IJ SOLN
0.5000 mg | INTRAMUSCULAR | Status: DC | PRN
  Administered 2017-02-17: 1 mg via INTRAVENOUS
  Filled 2017-02-17: qty 1

## 2017-02-17 MED ORDER — HYDROCODONE-ACETAMINOPHEN 5-325 MG PO TABS
1.0000 | ORAL_TABLET | ORAL | Status: DC | PRN
  Administered 2017-02-17: 2 via ORAL
  Filled 2017-02-17: qty 2

## 2017-02-17 MED ORDER — ESTRADIOL 0.025 MG/24HR TD PTWK: 0.0250 mg | MEDICATED_PATCH | TRANSDERMAL | Status: DC

## 2017-02-17 MED ORDER — CEFAZOLIN SODIUM-DEXTROSE 2-4 GM/100ML-% IV SOLN
2.0000 g | INTRAVENOUS | Status: AC
  Administered 2017-02-17: 2 g via INTRAVENOUS

## 2017-02-17 MED ORDER — BUPIVACAINE HCL (PF) 0.25 % IJ SOLN
INTRAMUSCULAR | Status: AC
  Filled 2017-02-17: qty 30

## 2017-02-17 MED ORDER — OXYCODONE HCL 5 MG PO TABS
ORAL_TABLET | ORAL | Status: AC
  Administered 2017-02-17: 5 mg
  Filled 2017-02-17: qty 1

## 2017-02-17 MED ORDER — KETOROLAC TROMETHAMINE 30 MG/ML IJ SOLN
INTRAMUSCULAR | Status: DC | PRN
  Administered 2017-02-17: 30 mg via INTRAVENOUS

## 2017-02-17 MED ORDER — LACTATED RINGERS IV SOLN
INTRAVENOUS | Status: DC | PRN
  Administered 2017-02-17 (×2): via INTRAVENOUS

## 2017-02-17 MED ORDER — SODIUM CHLORIDE 0.9 % IV SOLN: 250.0000 mL | INTRAVENOUS | Status: DC

## 2017-02-17 MED ORDER — PHENOL 1.4 % MT LIQD: 1.0000 | OROMUCOSAL | Status: DC | PRN

## 2017-02-17 MED ORDER — CYCLOBENZAPRINE HCL 10 MG PO TABS: 10.0000 mg | ORAL_TABLET | Freq: Three times a day (TID) | ORAL | Status: DC | PRN

## 2017-02-17 MED ORDER — PHENYLEPHRINE HCL 10 MG/ML IJ SOLN
INTRAMUSCULAR | Status: DC | PRN
  Administered 2017-02-17 (×4): 40 ug via INTRAVENOUS

## 2017-02-17 MED ORDER — PROGESTERONE MICRONIZED 100 MG PO CAPS
100.0000 mg | ORAL_CAPSULE | Freq: Every day | ORAL | Status: DC
  Administered 2017-02-17: 100 mg via ORAL
  Filled 2017-02-17: qty 1

## 2017-02-17 MED ORDER — LIDOCAINE 2% (20 MG/ML) 5 ML SYRINGE
INTRAMUSCULAR | Status: DC | PRN
  Administered 2017-02-17: 40 mg via INTRAVENOUS

## 2017-02-17 MED ORDER — CHLORHEXIDINE GLUCONATE CLOTH 2 % EX PADS: 6.0000 | MEDICATED_PAD | Freq: Once | CUTANEOUS | Status: DC

## 2017-02-17 MED ORDER — CEFAZOLIN SODIUM-DEXTROSE 2-4 GM/100ML-% IV SOLN
INTRAVENOUS | Status: AC
  Filled 2017-02-17: qty 100

## 2017-02-17 MED ORDER — KETOROLAC TROMETHAMINE 30 MG/ML IJ SOLN
INTRAMUSCULAR | Status: AC
  Filled 2017-02-17: qty 1

## 2017-02-17 MED ORDER — LIDOCAINE 2% (20 MG/ML) 5 ML SYRINGE
INTRAMUSCULAR | Status: AC
  Filled 2017-02-17: qty 5

## 2017-02-17 MED ORDER — HEMOSTATIC AGENTS (NO CHARGE) OPTIME
TOPICAL | Status: DC | PRN
  Administered 2017-02-17: 1 via TOPICAL

## 2017-02-17 MED ORDER — FENTANYL CITRATE (PF) 250 MCG/5ML IJ SOLN
INTRAMUSCULAR | Status: AC
  Filled 2017-02-17: qty 5

## 2017-02-17 MED ORDER — SODIUM CHLORIDE 0.9 % IR SOLN
Status: DC | PRN
  Administered 2017-02-17: 500 mL

## 2017-02-17 MED ORDER — PROPOFOL 10 MG/ML IV BOLUS
INTRAVENOUS | Status: AC
  Filled 2017-02-17: qty 20

## 2017-02-17 MED ORDER — FENTANYL CITRATE (PF) 100 MCG/2ML IJ SOLN
25.0000 ug | INTRAMUSCULAR | Status: DC | PRN
  Administered 2017-02-17 (×2): 50 ug via INTRAVENOUS

## 2017-02-17 MED ORDER — PHENYLEPHRINE 40 MCG/ML (10ML) SYRINGE FOR IV PUSH (FOR BLOOD PRESSURE SUPPORT)
PREFILLED_SYRINGE | INTRAVENOUS | Status: AC
  Filled 2017-02-17: qty 10

## 2017-02-17 MED ORDER — THROMBIN 5000 UNITS EX SOLR
CUTANEOUS | Status: AC
  Filled 2017-02-17: qty 10000

## 2017-02-17 MED ORDER — ONDANSETRON HCL 4 MG/2ML IJ SOLN
INTRAMUSCULAR | Status: DC | PRN
  Administered 2017-02-17: 4 mg via INTRAVENOUS

## 2017-02-17 MED ORDER — ONDANSETRON HCL 4 MG/2ML IJ SOLN
4.0000 mg | Freq: Four times a day (QID) | INTRAMUSCULAR | Status: DC | PRN
  Administered 2017-02-17 – 2017-02-18 (×2): 4 mg via INTRAVENOUS
  Filled 2017-02-17 (×2): qty 2

## 2017-02-17 MED ORDER — ONDANSETRON HCL 4 MG/2ML IJ SOLN
INTRAMUSCULAR | Status: AC
  Filled 2017-02-17: qty 2

## 2017-02-17 MED ORDER — VITAMIN D-3 25 MCG (1000 UT) PO CAPS: 1000.0000 [IU] | ORAL_CAPSULE | Freq: Every day | ORAL | Status: DC

## 2017-02-17 MED ORDER — OXYCODONE HCL 5 MG/5ML PO SOLN: 5.0000 mg | Freq: Once | ORAL | Status: DC | PRN

## 2017-02-17 MED ORDER — MENTHOL 3 MG MT LOZG: 1.0000 | LOZENGE | OROMUCOSAL | Status: DC | PRN

## 2017-02-17 MED ORDER — DEXAMETHASONE SODIUM PHOSPHATE 10 MG/ML IJ SOLN
INTRAMUSCULAR | Status: AC
  Filled 2017-02-17: qty 1

## 2017-02-17 MED ORDER — THROMBIN 5000 UNITS EX SOLR
CUTANEOUS | Status: DC | PRN
  Administered 2017-02-17 (×2): 5000 [IU] via TOPICAL

## 2017-02-17 MED ORDER — NEOSTIGMINE METHYLSULFATE 5 MG/5ML IV SOSY
PREFILLED_SYRINGE | INTRAVENOUS | Status: AC
  Filled 2017-02-17: qty 5

## 2017-02-17 MED ORDER — NEOSTIGMINE METHYLSULFATE 5 MG/5ML IV SOSY
PREFILLED_SYRINGE | INTRAVENOUS | Status: DC | PRN
  Administered 2017-02-17: 3 mg via INTRAVENOUS

## 2017-02-17 MED ORDER — ROCURONIUM BROMIDE 10 MG/ML (PF) SYRINGE
PREFILLED_SYRINGE | INTRAVENOUS | Status: DC | PRN
  Administered 2017-02-17: 50 mg via INTRAVENOUS

## 2017-02-17 MED ORDER — OXYCODONE HCL 5 MG PO TABS: 5.0000 mg | ORAL_TABLET | Freq: Once | ORAL | Status: DC | PRN

## 2017-02-17 MED ORDER — MIDAZOLAM HCL 2 MG/2ML IJ SOLN
INTRAMUSCULAR | Status: AC
  Filled 2017-02-17: qty 2

## 2017-02-17 MED ORDER — KETOROLAC TROMETHAMINE 15 MG/ML IJ SOLN
30.0000 mg | Freq: Four times a day (QID) | INTRAMUSCULAR | Status: DC
  Administered 2017-02-17 – 2017-02-18 (×3): 30 mg via INTRAVENOUS
  Filled 2017-02-17 (×3): qty 2

## 2017-02-17 MED ORDER — 0.9 % SODIUM CHLORIDE (POUR BTL) OPTIME
TOPICAL | Status: DC | PRN
  Administered 2017-02-17: 1000 mL

## 2017-02-17 MED ORDER — ADULT MULTIVITAMIN W/MINERALS CH: 1.0000 | ORAL_TABLET | Freq: Every day | ORAL | Status: DC

## 2017-02-17 MED ORDER — SODIUM CHLORIDE 0.9% FLUSH
3.0000 mL | Freq: Two times a day (BID) | INTRAVENOUS | Status: DC
  Administered 2017-02-17: 3 mL via INTRAVENOUS

## 2017-02-17 MED ORDER — ROCURONIUM BROMIDE 10 MG/ML (PF) SYRINGE
PREFILLED_SYRINGE | INTRAVENOUS | Status: AC
  Filled 2017-02-17: qty 5

## 2017-02-17 MED ORDER — SODIUM CHLORIDE 0.9% FLUSH: 3.0000 mL | INTRAVENOUS | Status: DC | PRN

## 2017-02-17 MED ORDER — DIPHENHYDRAMINE HCL 25 MG PO TABS
25.0000 mg | ORAL_TABLET | Freq: Two times a day (BID) | ORAL | Status: DC | PRN
  Filled 2017-02-17: qty 2

## 2017-02-17 MED ORDER — ONDANSETRON HCL 4 MG PO TABS
4.0000 mg | ORAL_TABLET | Freq: Four times a day (QID) | ORAL | Status: DC | PRN
  Administered 2017-02-17: 4 mg via ORAL
  Filled 2017-02-17: qty 1

## 2017-02-17 MED ORDER — FENTANYL CITRATE (PF) 100 MCG/2ML IJ SOLN
INTRAMUSCULAR | Status: AC
  Filled 2017-02-17: qty 2

## 2017-02-17 MED ORDER — ONDANSETRON HCL 4 MG PO TABS: 4.0000 mg | ORAL_TABLET | Freq: Three times a day (TID) | ORAL | Status: DC | PRN

## 2017-02-17 MED ORDER — MIDAZOLAM HCL 5 MG/5ML IJ SOLN
INTRAMUSCULAR | Status: DC | PRN
  Administered 2017-02-17: 2 mg via INTRAVENOUS

## 2017-02-17 MED ORDER — GABAPENTIN 600 MG PO TABS
600.0000 mg | ORAL_TABLET | Freq: Three times a day (TID) | ORAL | Status: DC
  Administered 2017-02-17 (×2): 600 mg via ORAL
  Filled 2017-02-17 (×2): qty 1

## 2017-02-17 SURGICAL SUPPLY — 55 items
ADH SKN CLS APL DERMABOND .7 (GAUZE/BANDAGES/DRESSINGS) ×1
APL SKNCLS STERI-STRIP NONHPOA (GAUZE/BANDAGES/DRESSINGS) ×1
BAG DECANTER FOR FLEXI CONT (MISCELLANEOUS) ×3 IMPLANT
BENZOIN TINCTURE PRP APPL 2/3 (GAUZE/BANDAGES/DRESSINGS) ×3 IMPLANT
BLADE CLIPPER SURG (BLADE) IMPLANT
BUR CUTTER 7.0 ROUND (BURR) ×3 IMPLANT
CANISTER SUCT 3000ML PPV (MISCELLANEOUS) ×3 IMPLANT
CARTRIDGE OIL MAESTRO DRILL (MISCELLANEOUS) ×1 IMPLANT
CLOSURE WOUND 1/2 X4 (GAUZE/BANDAGES/DRESSINGS) ×1
DECANTER SPIKE VIAL GLASS SM (MISCELLANEOUS) ×1 IMPLANT
DERMABOND ADVANCED (GAUZE/BANDAGES/DRESSINGS) ×2
DERMABOND ADVANCED .7 DNX12 (GAUZE/BANDAGES/DRESSINGS) ×1 IMPLANT
DIFFUSER DRILL AIR PNEUMATIC (MISCELLANEOUS) ×3 IMPLANT
DRAPE HALF SHEET 40X57 (DRAPES) IMPLANT
DRAPE LAPAROTOMY 100X72X124 (DRAPES) ×3 IMPLANT
DRAPE MICROSCOPE LEICA (MISCELLANEOUS) ×3 IMPLANT
DRAPE POUCH INSTRU U-SHP 10X18 (DRAPES) ×3 IMPLANT
DRAPE SURG 17X23 STRL (DRAPES) ×6 IMPLANT
DRSG OPSITE POSTOP 3X4 (GAUZE/BANDAGES/DRESSINGS) ×2 IMPLANT
DURAPREP 26ML APPLICATOR (WOUND CARE) ×3 IMPLANT
ELECT REM PT RETURN 9FT ADLT (ELECTROSURGICAL) ×3
ELECTRODE REM PT RTRN 9FT ADLT (ELECTROSURGICAL) ×1 IMPLANT
GAUZE SPONGE 4X4 12PLY STRL (GAUZE/BANDAGES/DRESSINGS) ×1 IMPLANT
GAUZE SPONGE 4X4 16PLY XRAY LF (GAUZE/BANDAGES/DRESSINGS) IMPLANT
GLOVE BIOGEL PI IND STRL 7.0 (GLOVE) IMPLANT
GLOVE BIOGEL PI IND STRL 7.5 (GLOVE) IMPLANT
GLOVE BIOGEL PI INDICATOR 7.0 (GLOVE) ×2
GLOVE BIOGEL PI INDICATOR 7.5 (GLOVE) ×2
GLOVE ECLIPSE 9.0 STRL (GLOVE) ×3 IMPLANT
GLOVE EXAM NITRILE LRG STRL (GLOVE) IMPLANT
GLOVE EXAM NITRILE XL STR (GLOVE) IMPLANT
GLOVE EXAM NITRILE XS STR PU (GLOVE) IMPLANT
GLOVE SURG SS PI 7.0 STRL IVOR (GLOVE) ×4 IMPLANT
GOWN STRL REUS W/ TWL LRG LVL3 (GOWN DISPOSABLE) IMPLANT
GOWN STRL REUS W/ TWL XL LVL3 (GOWN DISPOSABLE) ×1 IMPLANT
GOWN STRL REUS W/TWL 2XL LVL3 (GOWN DISPOSABLE) IMPLANT
GOWN STRL REUS W/TWL LRG LVL3 (GOWN DISPOSABLE) ×3
GOWN STRL REUS W/TWL XL LVL3 (GOWN DISPOSABLE) ×3
KIT BASIN OR (CUSTOM PROCEDURE TRAY) ×3 IMPLANT
KIT ROOM TURNOVER OR (KITS) ×3 IMPLANT
NDL SPNL 22GX3.5 QUINCKE BK (NEEDLE) ×1 IMPLANT
NEEDLE HYPO 22GX1.5 SAFETY (NEEDLE) ×3 IMPLANT
NEEDLE SPNL 22GX3.5 QUINCKE BK (NEEDLE) IMPLANT
NS IRRIG 1000ML POUR BTL (IV SOLUTION) ×3 IMPLANT
OIL CARTRIDGE MAESTRO DRILL (MISCELLANEOUS) ×3
PACK LAMINECTOMY NEURO (CUSTOM PROCEDURE TRAY) ×3 IMPLANT
PAD ARMBOARD 7.5X6 YLW CONV (MISCELLANEOUS) ×17 IMPLANT
RUBBERBAND STERILE (MISCELLANEOUS) ×6 IMPLANT
SPONGE SURGIFOAM ABS GEL SZ50 (HEMOSTASIS) ×3 IMPLANT
STRIP CLOSURE SKIN 1/2X4 (GAUZE/BANDAGES/DRESSINGS) ×2 IMPLANT
SUT VIC AB 2-0 CT1 18 (SUTURE) ×3 IMPLANT
SUT VIC AB 3-0 SH 8-18 (SUTURE) ×3 IMPLANT
TOWEL GREEN STERILE (TOWEL DISPOSABLE) ×2 IMPLANT
TOWEL GREEN STERILE FF (TOWEL DISPOSABLE) ×3 IMPLANT
WATER STERILE IRR 1000ML POUR (IV SOLUTION) ×3 IMPLANT

## 2017-02-17 NOTE — Op Note (Signed)
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  Asst. Surgeon: Christella Noa  Anesthesia: General  Indication: 56 year old female with severe right lower extremity pain paresthesias and weakness consistent with a right L4 radiculopathy and failing conservative management. Workup demonstrates evidence of a focal large right-sided L4-5 foraminal disc herniation with marked compression the right L4 nerve root. Patient presents now for microdiscectomy in hopes of improving her symptoms.  Operative note: After induction anesthesia, patient position prone onto Wilson frame and a properly padded. Lumbar region prepped and draped sterilely. Incision made overlying L4-5. Dissection performed on the right. Retractor placed. X-ray taken. L3-4 level exposed. Dissection redirected 1 level caudally. Retractor replaced. Extra foraminal approach performed by removing a small aspect of the superior lateral superior articular process of L5 as well as the lateral aspect of the pars interarticularis of L4. Intertransverse ligament elevated and resected. Microscope brought field using microdissection of the extraforaminal space. L4 nerve root identified. This was mobilized gently cephalad. Disc herniation was readily apparent. This is dissected free and removed and multiple moderately large fragments. At this point a very thorough decompression the nerve had been achieved. There was no evidence of injury to the nerve root sleeve or thecal sac. Wounds and irrigated MI solution. Gelfoam was placed topically for hemostasis. Wounds and close in layers with Vicryl sutures. Steri-Strips and sterile dressing were applied. No apparent complications. Patient tolerated the procedure well  and she returns to the recovery room postop.

## 2017-02-17 NOTE — Anesthesia Preprocedure Evaluation (Addendum)
Anesthesia Evaluation  Patient identified by MRN, date of birth, ID band Patient awake    Reviewed: Allergy & Precautions, NPO status , Patient's Chart, lab work & pertinent test results  Airway Mallampati: II  TM Distance: >3 FB Neck ROM: Full    Dental  (+) Teeth Intact, Dental Advisory Given   Pulmonary    breath sounds clear to auscultation       Cardiovascular hypertension,  Rhythm:Regular Rate:Normal     Neuro/Psych    GI/Hepatic   Endo/Other    Renal/GU      Musculoskeletal   Abdominal   Peds  Hematology   Anesthesia Other Findings   Reproductive/Obstetrics                            Anesthesia Physical Anesthesia Plan  ASA: II  Anesthesia Plan: General   Post-op Pain Management:    Induction: Intravenous  Airway Management Planned: Oral ETT  Additional Equipment:   Intra-op Plan:   Post-operative Plan: Extubation in OR  Informed Consent: I have reviewed the patients History and Physical, chart, labs and discussed the procedure including the risks, benefits and alternatives for the proposed anesthesia with the patient or authorized representative who has indicated his/her understanding and acceptance.   Dental advisory given  Plan Discussed with: CRNA and Anesthesiologist  Anesthesia Plan Comments:        Anesthesia Quick Evaluation

## 2017-02-17 NOTE — Transfer of Care (Signed)
Immediate Anesthesia Transfer of Care Note  Patient: Audrey Bird  Procedure(s) Performed: Procedure(s) with comments: Microdiscectomy - right - Lumbar Four-Five extraforaminal (Right) - right  Patient Location: PACU  Anesthesia Type:General  Level of Consciousness: patient cooperative and responds to stimulation  Airway & Oxygen Therapy: Patient Spontanous Breathing and Patient connected to nasal cannula oxygen  Post-op Assessment: Report given to RN and Post -op Vital signs reviewed and stable  Post vital signs: Reviewed and stable  Last Vitals:  Vitals:   02/17/17 0723 02/17/17 1000  BP: 126/85 134/84  Pulse: 76 62  Resp: 18 14  Temp: 36.6 C 36.6 C    Last Pain:  Vitals:   02/17/17 1000  TempSrc:   PainSc: 0-No pain      Patients Stated Pain Goal: 4 (54/00/86 7619)  Complications: No apparent anesthesia complications

## 2017-02-17 NOTE — H&P (Signed)
Audrey Bird is an 56 y.o. female.   Chief Complaint: Right leg pain HPI: 56 year old female with severe right lower extremity pain extending to her right anterior thigh and anterior medial leg. Symptoms aggravated by standing or walking. Symptoms associated with numbness and weakness. Patient is failed conservative management. Workup demonstrates evidence of a large right-sided L4-5 extraforaminal disc herniation. Patient presents now for extra foraminal microdiscectomy in hopes of improving her symptoms.  Past Medical History:  Diagnosis Date  . Anxiety   . Arthritis   . Fracture of pelvis    history of  . Headache   . Heart murmur    4 YRS   ASYMPTOMATIC  . Hip pain, right    chronic  . History of stomach ulcers 2011   Overuse of NSAIDS  . Hyperlipidemia   . Hypertension   . Lumbago    history of  . S/P cardiac cath 04/07/10   mild 3 vessel disease, cornary spasm     Past Surgical History:  Procedure Laterality Date  . COLONOSCOPY  06/22/2012   Procedure: COLONOSCOPY;  Surgeon: Rogene Houston, MD;  [facility]: [facility];  Service: Endoscopy;  Laterality: N/A;  100  . CYSTECTOMY     hand  . HAND SURGERY    . HEMORRHOID SURGERY    . PAIN PUMP IMPLANTATION     SINCE BEEN REMOVED    Family History  Problem Relation Age of Onset  . Cancer Father     skin  . Aneurysm Father     brain  . Cancer Mother     thyroid    Social History:  reports that she has never smoked. She has never used smokeless tobacco. She reports that she does not drink alcohol or use drugs.  Allergies:  Allergies  Allergen Reactions  . No Known Allergies     Medications Prior to Admission  Medication Sig Dispense Refill  . Aspirin-Salicylamide-Caffeine (BC HEADACHE POWDER PO) Take 1 packet by mouth every 6 (six) hours as needed (pain). Pain    . Cholecalciferol (VITAMIN D-3) 1000 units CAPS Take 1,000 Units by mouth daily.     . Cholecalciferol 4000 units CAPS Take 4,000 Units by mouth  daily.    . cyclobenzaprine (FLEXERIL) 5 MG tablet Take 1 tablet (5 mg total) by mouth 3 (three) times daily as needed for muscle spasms. (Patient taking differently: Take 5 mg by mouth 3 (three) times daily as needed for muscle spasms. ) 15 tablet 0  . diphenhydrAMINE (BENADRYL) 25 MG tablet Take 25-50 mg by mouth 2 (two) times daily as needed for allergies. 1 tablet in the morning and 2 in the evening as needed for allergies    . estradiol (CLIMARA - DOSED IN MG/24 HR) 0.025 mg/24hr patch Place 0.025 mg onto the skin once a week. Mondays    . gabapentin (NEURONTIN) 600 MG tablet Take 600 mg by mouth 3 (three) times daily.    . metoprolol tartrate (LOPRESSOR) 25 MG tablet Take 25 mg by mouth 2 (two) times daily.     . Multiple Vitamin (MULITIVITAMIN WITH MINERALS) TABS Take 1 tablet by mouth daily.    . naproxen (NAPROSYN) 500 MG tablet Take 1 tablet (500 mg total) by mouth 2 (two) times daily. (Patient taking differently: Take 500 mg by mouth daily. ) 30 tablet 0  . Omega-3 Fatty Acids (FISH OIL) 1000 MG CAPS Take 2,000 mg by mouth daily.    . ondansetron (ZOFRAN) 4 MG tablet  Take 4 mg by mouth every 8 (eight) hours as needed for nausea or vomiting.    . progesterone (PROMETRIUM) 100 MG capsule Take 100 mg by mouth at bedtime.     Marland Kitchen UNABLE TO FIND Apply 1 application topically 3 (three) times daily as needed (pain). [place] Gel  Gets from Restaurant manager, fast food    . HYDROcodone-acetaminophen (NORCO/VICODIN) 5-325 MG tablet Take 1 tablet by mouth every 6 (six) hours as needed. (Patient not taking: Reported on 01/19/2017) 20 tablet 0  . predniSONE (DELTASONE) 10 MG tablet Take 6 tablets day one, 5 tablets day two, 4 tablets day three, 3 tablets day four, 2 tablets day five, then 1 tablet day six (Patient not taking: Reported on 01/19/2017) 21 tablet 0    No results found for this or any previous visit (from the past 48 hour(s)). No results found.  Pertinent items noted in HPI and remainder of comprehensive ROS  otherwise negative.  Blood pressure 126/85, pulse 76, temperature 97.8 F (36.6 C), temperature source Oral, resp. rate 18, SpO2 98 %.  Patient is awake and alert. She is oriented and appropriate. Her cranial nerve function is intact. Her motor and sensory function reveals weakness of her right quadriceps muscle group and weakness of her anterior tibialis both grating out of 45. Remainder of motor strength intact. Sensory examination was decreased sensation pinprick and light touch in her right L4 dermatome. Deep tendon reflexes normal active except her right patellar reflex is diminished. No evidence of long track signs. Gait and posture her abnormal with a flexed posture and and antalgic gait. Examination head ears eyes and throat are marked. Chest and abdomen are benign. Extremities are free from injury deformity. Assessment/Plan Right L4-5 extraforaminal herniated nucleus pulposus with radiculopathy. Plan right L4-5 foraminal microdiscectomy. Risks and benefits of been explained. Patient wishes to proceed.  Billi Bright A 02/17/2017, 8:07 AM

## 2017-02-17 NOTE — Brief Op Note (Signed)
02/17/2017  9:51 AM  PATIENT:  Audrey Bird  56 y.o. female  PRE-OPERATIVE DIAGNOSIS:  HNP  POST-OPERATIVE DIAGNOSIS:  Herniated Nucleous Pulposus  PROCEDURE:  Procedure(s) with comments: Microdiscectomy - right - Lumbar Four-Five extraforaminal (Right) - right  SURGEON:  Surgeon(s) and Role:    * Earnie Larsson, MD - Primary    * Ashok Pall, MD - Assisting  PHYSICIAN ASSISTANT:   ASSISTANTS:    ANESTHESIA:   general  EBL:  Total I/O In: 1000 [I.V.:1000] Out: 100 [Blood:100]  BLOOD ADMINISTERED:none  DRAINS: none   LOCAL MEDICATIONS USED:  MARCAINE     SPECIMEN:  No Specimen  DISPOSITION OF SPECIMEN:  N/A  COUNTS:  YES  TOURNIQUET:  * No tourniquets in log *  DICTATION: .Dragon Dictation  PLAN OF CARE: Admit for overnight observation  PATIENT DISPOSITION:  PACU - hemodynamically stable.   Delay start of Pharmacological VTE agent (>24hrs) due to surgical blood loss or risk of bleeding: yes

## 2017-02-17 NOTE — Progress Notes (Signed)
Iv restated by Albertina Senegal crna w/o diddicult in left hand

## 2017-02-17 NOTE — Anesthesia Postprocedure Evaluation (Signed)
Anesthesia Post Note  Patient: Audrey Bird  Procedure(s) Performed: Procedure(s) (LRB): Microdiscectomy - right - Lumbar Four-Five extraforaminal (Right)  Patient location during evaluation: PACU Anesthesia Type: General Level of consciousness: awake, awake and alert and oriented Pain management: pain level controlled Vital Signs Assessment: post-procedure vital signs reviewed and stable Respiratory status: spontaneous breathing, nonlabored ventilation and respiratory function stable Cardiovascular status: blood pressure returned to baseline Anesthetic complications: no       Last Vitals:  Vitals:   02/17/17 1100 02/17/17 1114  BP:  (!) 143/82  Pulse: 66   Resp: 14 18  Temp: 36.6 C 36.6 C    Last Pain:  Vitals:   02/17/17 1100  TempSrc:   PainSc: 4                  Mitchel Delduca COKER

## 2017-02-17 NOTE — Anesthesia Postprocedure Evaluation (Signed)
Anesthesia Post Note  Patient: Audrey Bird  Procedure(s) Performed: Procedure(s) (LRB): Microdiscectomy - right - Lumbar Four-Five extraforaminal (Right)  Patient location during evaluation: PACU Anesthesia Type: General Level of consciousness: awake, awake and alert and oriented Pain management: pain level controlled Vital Signs Assessment: post-procedure vital signs reviewed and stable Respiratory status: spontaneous breathing and nonlabored ventilation Cardiovascular status: blood pressure returned to baseline Anesthetic complications: no       Last Vitals:  Vitals:   02/17/17 1100 02/17/17 1114  BP:  (!) 143/82  Pulse: 66   Resp: 14 18  Temp: 36.6 C 36.6 C    Last Pain:  Vitals:   02/17/17 1555  TempSrc:   PainSc: 5                  Aaren Atallah COKER

## 2017-02-18 ENCOUNTER — Encounter (HOSPITAL_COMMUNITY): Payer: Self-pay | Admitting: Neurosurgery

## 2017-02-18 DIAGNOSIS — M5116 Intervertebral disc disorders with radiculopathy, lumbar region: Secondary | ICD-10-CM | POA: Diagnosis not present

## 2017-02-18 DIAGNOSIS — R011 Cardiac murmur, unspecified: Secondary | ICD-10-CM | POA: Diagnosis not present

## 2017-02-18 DIAGNOSIS — Z8719 Personal history of other diseases of the digestive system: Secondary | ICD-10-CM | POA: Diagnosis not present

## 2017-02-18 DIAGNOSIS — M79604 Pain in right leg: Secondary | ICD-10-CM | POA: Diagnosis not present

## 2017-02-18 DIAGNOSIS — M1990 Unspecified osteoarthritis, unspecified site: Secondary | ICD-10-CM | POA: Diagnosis not present

## 2017-02-18 DIAGNOSIS — F419 Anxiety disorder, unspecified: Secondary | ICD-10-CM | POA: Diagnosis not present

## 2017-02-18 MED ORDER — HYDROCODONE-ACETAMINOPHEN 5-325 MG PO TABS: 1.0000 | ORAL_TABLET | Freq: Four times a day (QID) | ORAL | 0 refills | Status: DC | PRN

## 2017-02-18 MED ORDER — CYCLOBENZAPRINE HCL 10 MG PO TABS: 10.0000 mg | ORAL_TABLET | Freq: Three times a day (TID) | ORAL | 0 refills | Status: AC | PRN

## 2017-02-18 NOTE — Progress Notes (Signed)
Patient alert and oriented, mae's well, voiding adequate amount of urine, swallowing without difficulty, no c/o pain at time of discharge. Patient discharged home with family. Script and discharged instructions given to patient. Patient and family stated understanding of instructions given. Patient has an appointment with Dr. Pool  

## 2017-02-18 NOTE — Discharge Instructions (Signed)

## 2017-02-18 NOTE — Discharge Summary (Signed)
Physician Discharge Summary  Patient ID: Audrey Bird MRN: [id number] DOB/AGE: [birth year] 56 y.o.  Admit date: 02/17/2017 Discharge date: 02/18/2017  Admission Diagnoses:  Discharge Diagnoses:  Active Problems:   HNP (herniated nucleus pulposus), lumbar   Discharged Condition: good  Hospital Course: Patient admitted to the hospital where she underwent an uncomplicated right-sided F0-Y6 extraforaminal microdiscectomy. Postoperative she is doing well. Preoperative back and right lower extremity pain much improved. Ambulating without difficulty. Ready for discharge home.  Consults:   Significant Diagnostic Studies:   Treatments:   Discharge Exam: Blood pressure 135/82, pulse 65, temperature 97.6 F (36.4 C), temperature source Oral, resp. rate 20, SpO2 100 %. Awake and alert. Oriented and appropriate. Cranial nerve function intact. Motor and sensory function extremities normal. Wound clean and dry. Chest and abdomen benign.  Disposition: 01-Home or Self Care   Allergies as of 02/18/2017      Reactions   No Known Allergies       Medication List    TAKE these medications   BC HEADACHE POWDER PO Take 1 packet by mouth every 6 (six) hours as needed (pain). Pain   cyclobenzaprine 10 MG tablet Commonly known as:  FLEXERIL Take 1 tablet (10 mg total) by mouth 3 (three) times daily as needed for muscle spasms. What changed:  medication strength  how much to take   diphenhydrAMINE 25 MG tablet Commonly known as:  BENADRYL Take 25-50 mg by mouth 2 (two) times daily as needed for allergies. 1 tablet in the morning and 2 in the evening as needed for allergies   estradiol 0.025 mg/24hr patch Commonly known as:  CLIMARA - Dosed in mg/24 hr Place 0.025 mg onto the skin once a week. Mondays   Fish Oil 1000 MG Caps Take 2,000 mg by mouth daily.   gabapentin 600 MG tablet Commonly known as:  NEURONTIN Take 600 mg by mouth 3 (three) times daily.    HYDROcodone-acetaminophen 5-325 MG tablet Commonly known as:  NORCO/VICODIN Take 1-2 tablets by mouth every 6 (six) hours as needed. What changed:  how much to take   metoprolol tartrate 25 MG tablet Commonly known as:  LOPRESSOR Take 25 mg by mouth 2 (two) times daily.   multivitamin with minerals Tabs tablet Take 1 tablet by mouth daily.   naproxen 500 MG tablet Commonly known as:  NAPROSYN Take 1 tablet (500 mg total) by mouth 2 (two) times daily. What changed:  when to take this   ondansetron 4 MG tablet Commonly known as:  ZOFRAN Take 4 mg by mouth every 8 (eight) hours as needed for nausea or vomiting.   predniSONE 10 MG tablet Commonly known as:  DELTASONE Take 6 tablets day one, 5 tablets day two, 4 tablets day three, 3 tablets day four, 2 tablets day five, then 1 tablet day six   progesterone 100 MG capsule Commonly known as:  PROMETRIUM Take 100 mg by mouth at bedtime.   UNABLE TO FIND Apply 1 application topically 3 (three) times daily as needed (pain). [place] Gel  Gets from chiropractor   Vitamin D-3 1000 units Caps Take 1,000 Units by mouth daily.   Cholecalciferol 4000 units Caps Take 4,000 Units by mouth daily.        Signed: Maysen Bonsignore A 02/18/2017, 7:57 AM

## 2017-06-04 ENCOUNTER — Encounter (INDEPENDENT_AMBULATORY_CARE_PROVIDER_SITE_OTHER): Payer: Self-pay | Admitting: *Deleted

## 2018-01-06 DIAGNOSIS — I10 Essential (primary) hypertension: Secondary | ICD-10-CM | POA: Diagnosis not present

## 2018-01-06 DIAGNOSIS — Z8249 Family history of ischemic heart disease and other diseases of the circulatory system: Secondary | ICD-10-CM | POA: Diagnosis not present

## 2018-01-06 DIAGNOSIS — R918 Other nonspecific abnormal finding of lung field: Secondary | ICD-10-CM | POA: Diagnosis not present

## 2018-01-06 DIAGNOSIS — R51 Headache: Secondary | ICD-10-CM | POA: Diagnosis not present

## 2019-02-10 IMAGING — CT CT ABD-PELV W/ CM
2 of 5 series · 16 of 46 positions shown, 18 images · IV contrast (Isovue)
Comparison: MRI 11/26/2016.  CT 04/05/2010

CLINICAL DATA: Severe right lower quadrant abdominal pain over the
last 2 weeks. Pain radiates to the anterior right leg.

EXAM:
CT ABDOMEN AND PELVIS WITH CONTRAST
TECHNIQUE: Multidetector CT imaging of the abdomen and pelvis was performed
using the standard protocol following bolus administration of
intravenous contrast.
CONTRAST:  100mL CBDR3V-QJJ IOPAMIDOL (CBDR3V-QJJ) INJECTION 61%

[Series 2: axial st · axial · 0.73mm/px · z∈[+968,+1368]mm · 13 of 91 slices shown, 15 images]
[im 6/91  soft-tissue]
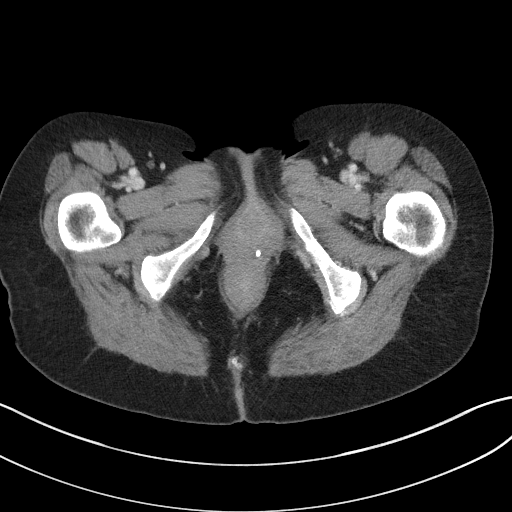
[im 6/91  bone]
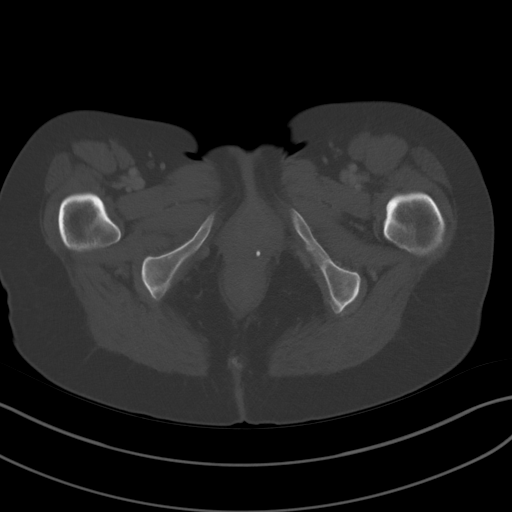
[im 11/91  soft-tissue]
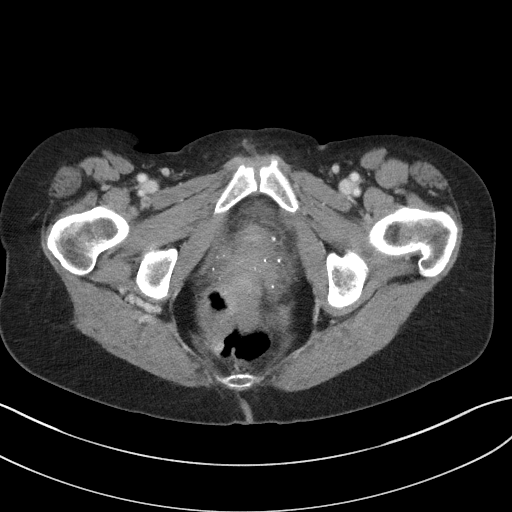
[im 21/91  soft-tissue]
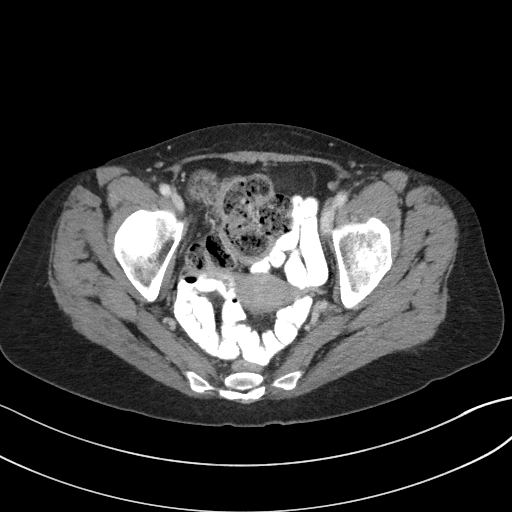
[im 26/91  soft-tissue]
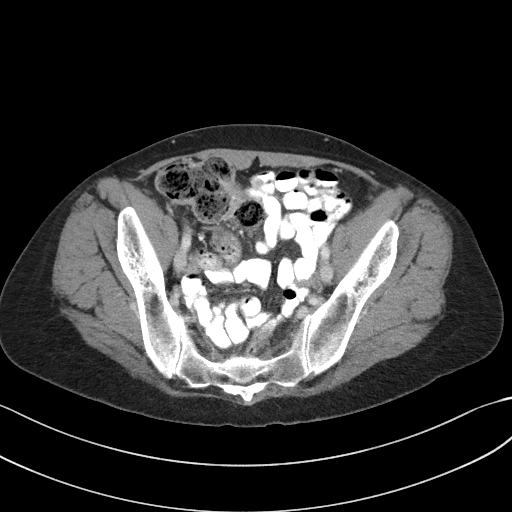
[im 31/91  soft-tissue]
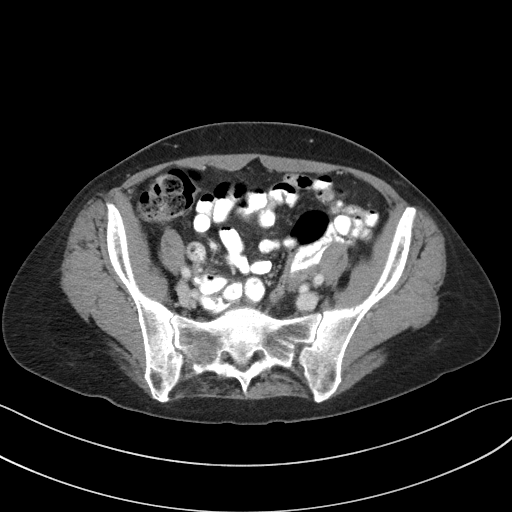
[im 41/91  soft-tissue]
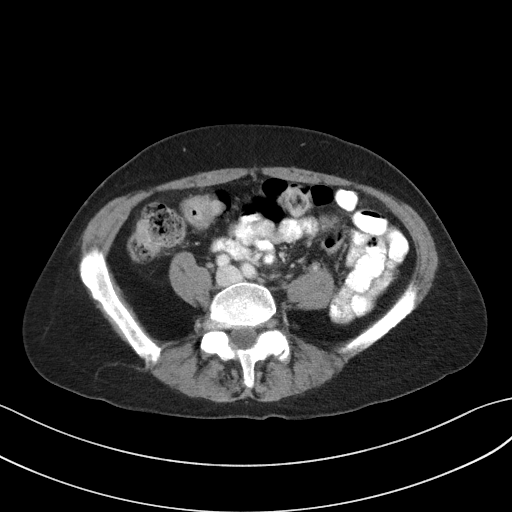
[im 46/91  soft-tissue]
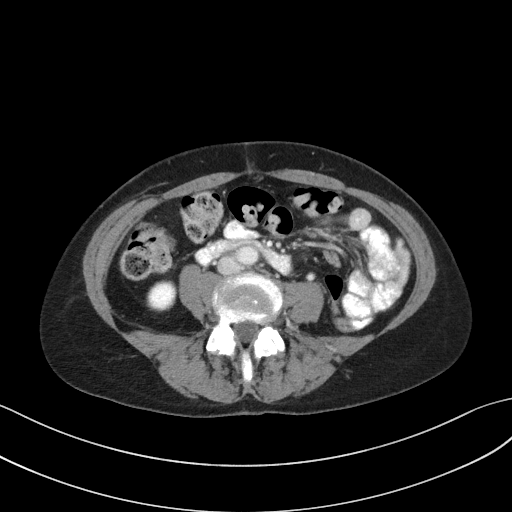
[im 51/91  soft-tissue]
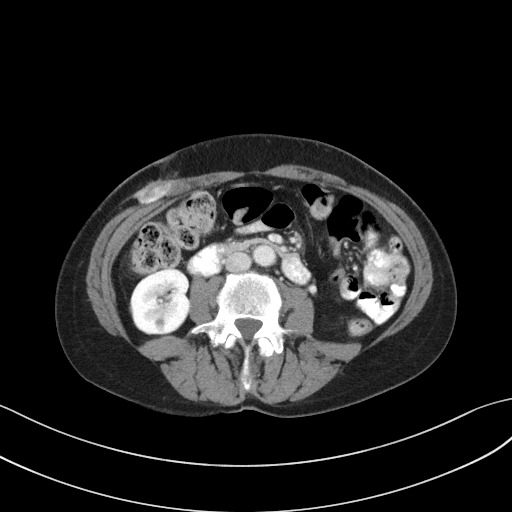
[im 61/91  soft-tissue]
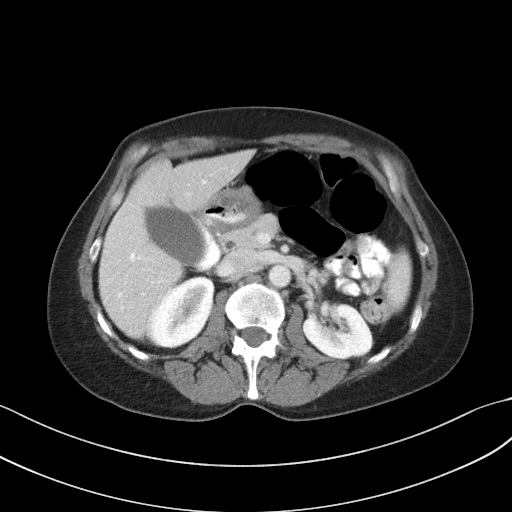
[im 61/91  bone]
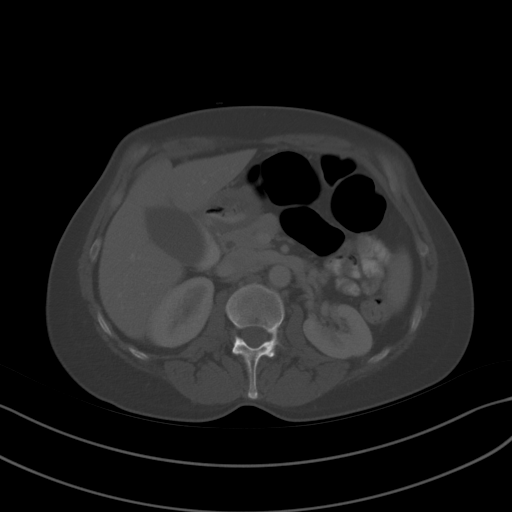
[im 66/91  soft-tissue]
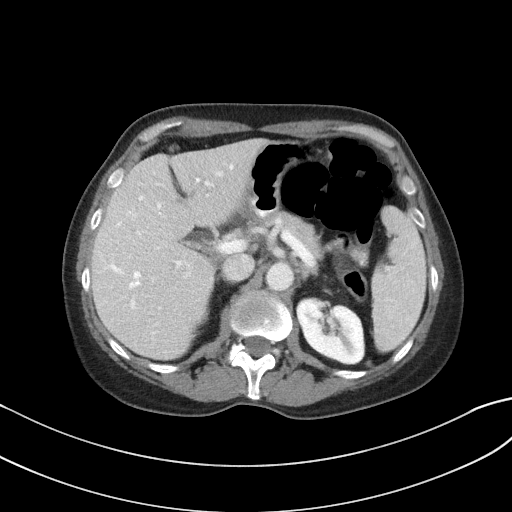
[im 71/91  soft-tissue]
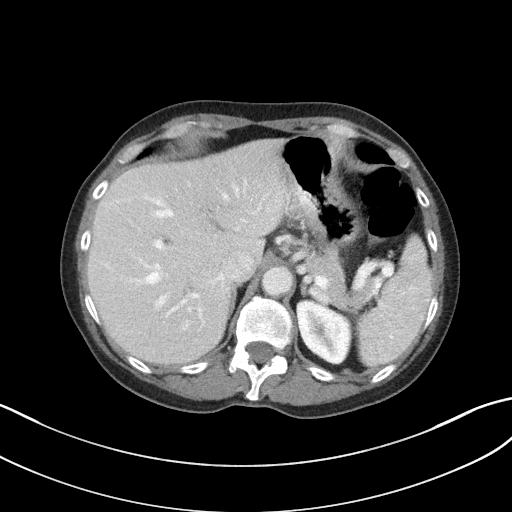
[im 81/91  soft-tissue]
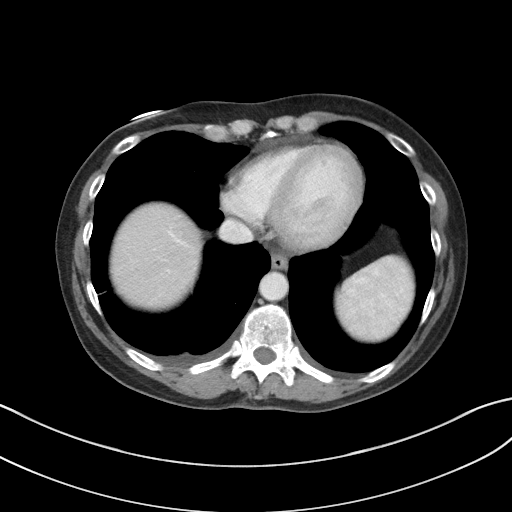
[im 86/91  soft-tissue]
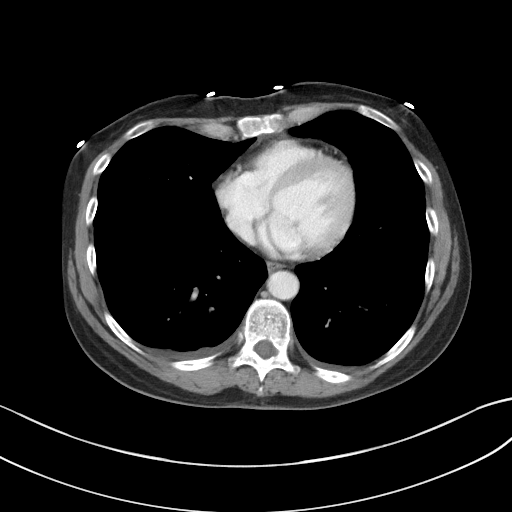

[Series 5: coronal st · coronal · 0.79mm/px · 3 of 89 slices shown]
[im 30/89  soft-tissue]
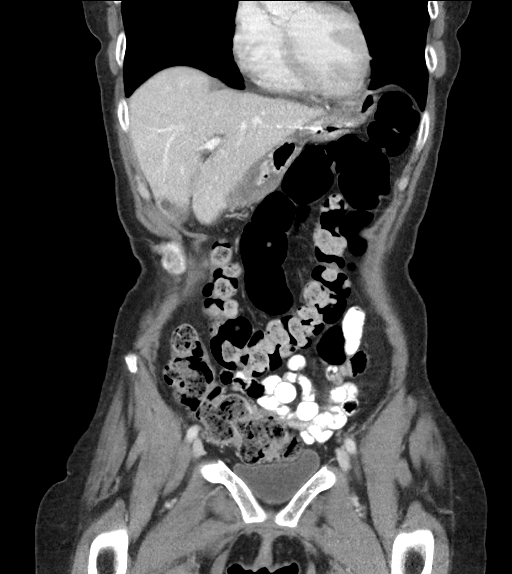
[im 40/89  soft-tissue]
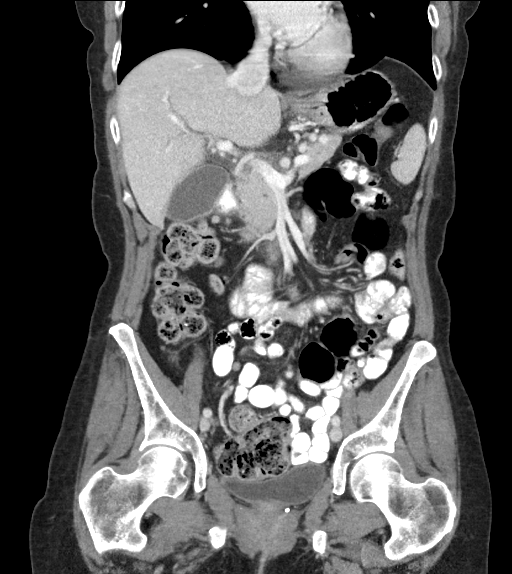
[im 49/89  soft-tissue]
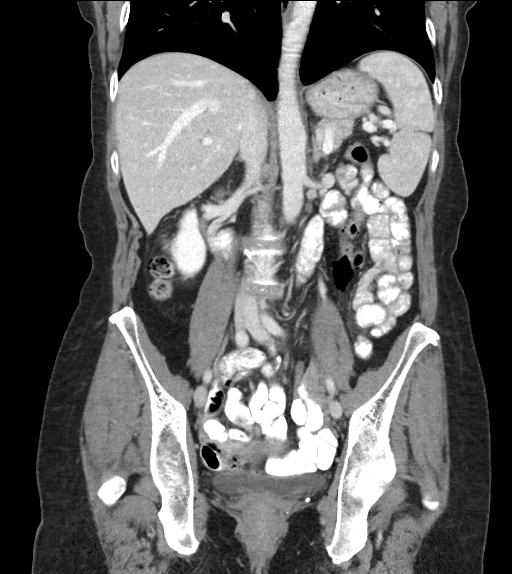

[16 of 46 positions shown; findings below may reference images not displayed]

FINDINGS: Lower chest: Small pleural effusions layering dependently, etiology
unknown. No pulmonary parenchymal finding. Small amount of
pericardial fluid as well.

Hepatobiliary: Liver parenchyma is normal.  No calcified gallstones.

Pancreas: Normal

Spleen: Normal

Adrenals/Urinary Tract: Adrenal glands are normal. Right kidney is
normal except for a 1 cm simple cyst in the lower pole. Left kidney
is normal. No bladder abnormality seen.

Stomach/Bowel: Low lying cecum within the right pelvis. Moderate
amount of fecal matter. I can not specifically identify the
appendix. No abnormal or inflamed intestine is seen in that region.
No sign of bowel obstruction or bowel mass.

Vascular/Lymphatic: Aorta and IVC are normal. No retroperitoneal
lymphadenopathy.

Reproductive: Uterus and adnexal regions appear normal.

Other: 2.7 x 1.1 x 3.1 cm lesion of the anterior abdominal wall on
the right which appears show enhancement. Nonspecific but this could
represent a desmoid tumor or other mesenchymal neoplasm. The
overlying soft tissues appear somewhat distorted in this may have
been biopsied in the past. Alternatively, this could be some sort of
calcified scar related to previous trauma. This was present in a
very similar appearance in 7377 and therefore it is unlikely to
represent any aggressive or progressive lesion.

Musculoskeletal: Ordinary spinal degenerative changes.
IMPRESSION: No intraperitoneal abnormality seen. Low lying cecum, in the right
pelvis. I cannot identify the appendix, either normal or abnormal.
Absence of any inflamed or edematous bowel in that region argues
against appendicitis.

Small pericardial effusion and pleural effusions, etiology unknown.

2.7 x 1.1 x 3.1 cm enhancing lesion of the anterior abdominal wall
to the right of midline just beneath the ribcage. Nonspecific. Very
low lip any change since 7377, therefore probably not worrisome.
This could represent a desmoid or other sort of mesenchymal
neoplasm. Question if this has been biopsied in the past.
Alternatively, it could be some sort of calcified scar.

## 2020-02-16 DIAGNOSIS — M545 Low back pain: Secondary | ICD-10-CM | POA: Diagnosis not present

## 2020-02-16 DIAGNOSIS — Z6822 Body mass index (BMI) 22.0-22.9, adult: Secondary | ICD-10-CM | POA: Diagnosis not present

## 2020-08-15 ENCOUNTER — Other Ambulatory Visit: Payer: Self-pay

## 2020-08-15 ENCOUNTER — Ambulatory Visit: Payer: Medicare Other | Admitting: Orthopaedic Surgery

## 2020-11-05 DIAGNOSIS — M5416 Radiculopathy, lumbar region: Secondary | ICD-10-CM | POA: Diagnosis not present

## 2020-11-05 DIAGNOSIS — Z6823 Body mass index (BMI) 23.0-23.9, adult: Secondary | ICD-10-CM | POA: Diagnosis not present

## 2020-11-05 DIAGNOSIS — T39091A Poisoning by salicylates, accidental (unintentional), initial encounter: Secondary | ICD-10-CM | POA: Diagnosis not present

## 2020-11-05 DIAGNOSIS — M545 Low back pain, unspecified: Secondary | ICD-10-CM | POA: Diagnosis not present

## 2020-11-05 DIAGNOSIS — M47818 Spondylosis without myelopathy or radiculopathy, sacral and sacrococcygeal region: Secondary | ICD-10-CM | POA: Diagnosis not present

## 2021-02-04 DIAGNOSIS — M545 Low back pain, unspecified: Secondary | ICD-10-CM | POA: Diagnosis not present

## 2021-02-04 DIAGNOSIS — Z7689 Persons encountering health services in other specified circumstances: Secondary | ICD-10-CM | POA: Diagnosis not present

## 2021-02-04 DIAGNOSIS — E782 Mixed hyperlipidemia: Secondary | ICD-10-CM | POA: Diagnosis not present

## 2021-02-04 DIAGNOSIS — Z6823 Body mass index (BMI) 23.0-23.9, adult: Secondary | ICD-10-CM | POA: Diagnosis not present

## 2021-02-04 DIAGNOSIS — Z0000 Encounter for general adult medical examination without abnormal findings: Secondary | ICD-10-CM | POA: Diagnosis not present

## 2021-02-04 DIAGNOSIS — E038 Other specified hypothyroidism: Secondary | ICD-10-CM | POA: Diagnosis not present

## 2021-04-16 DIAGNOSIS — Z23 Encounter for immunization: Secondary | ICD-10-CM | POA: Diagnosis not present

## 2021-05-07 DIAGNOSIS — Z23 Encounter for immunization: Secondary | ICD-10-CM | POA: Diagnosis not present

## 2021-05-15 ENCOUNTER — Encounter (INDEPENDENT_AMBULATORY_CARE_PROVIDER_SITE_OTHER): Payer: Self-pay

## 2021-05-23 DIAGNOSIS — S39012A Strain of muscle, fascia and tendon of lower back, initial encounter: Secondary | ICD-10-CM | POA: Diagnosis not present

## 2021-05-23 DIAGNOSIS — Z6822 Body mass index (BMI) 22.0-22.9, adult: Secondary | ICD-10-CM | POA: Diagnosis not present

## 2021-05-23 DIAGNOSIS — M5430 Sciatica, unspecified side: Secondary | ICD-10-CM | POA: Diagnosis not present

## 2021-08-19 DIAGNOSIS — Z6822 Body mass index (BMI) 22.0-22.9, adult: Secondary | ICD-10-CM | POA: Diagnosis not present

## 2021-08-19 DIAGNOSIS — R252 Cramp and spasm: Secondary | ICD-10-CM | POA: Diagnosis not present

## 2021-08-19 DIAGNOSIS — E559 Vitamin D deficiency, unspecified: Secondary | ICD-10-CM | POA: Diagnosis not present

## 2021-08-19 DIAGNOSIS — M169 Osteoarthritis of hip, unspecified: Secondary | ICD-10-CM | POA: Diagnosis not present

## 2021-08-19 DIAGNOSIS — M5430 Sciatica, unspecified side: Secondary | ICD-10-CM | POA: Diagnosis not present

## 2021-10-21 DIAGNOSIS — J019 Acute sinusitis, unspecified: Secondary | ICD-10-CM | POA: Diagnosis not present

## 2021-10-21 DIAGNOSIS — Z6822 Body mass index (BMI) 22.0-22.9, adult: Secondary | ICD-10-CM | POA: Diagnosis not present

## 2021-10-21 DIAGNOSIS — R059 Cough, unspecified: Secondary | ICD-10-CM | POA: Diagnosis not present

## 2021-11-13 DIAGNOSIS — M545 Low back pain, unspecified: Secondary | ICD-10-CM | POA: Diagnosis not present

## 2021-11-13 DIAGNOSIS — Z6823 Body mass index (BMI) 23.0-23.9, adult: Secondary | ICD-10-CM | POA: Diagnosis not present

## 2021-11-13 DIAGNOSIS — M5430 Sciatica, unspecified side: Secondary | ICD-10-CM | POA: Diagnosis not present

## 2021-12-09 DIAGNOSIS — M545 Low back pain, unspecified: Secondary | ICD-10-CM | POA: Diagnosis not present

## 2021-12-09 DIAGNOSIS — M461 Sacroiliitis, not elsewhere classified: Secondary | ICD-10-CM | POA: Diagnosis not present

## 2021-12-09 DIAGNOSIS — Z6823 Body mass index (BMI) 23.0-23.9, adult: Secondary | ICD-10-CM | POA: Diagnosis not present

## 2021-12-09 DIAGNOSIS — M169 Osteoarthritis of hip, unspecified: Secondary | ICD-10-CM | POA: Diagnosis not present

## 2022-02-24 DIAGNOSIS — Z1231 Encounter for screening mammogram for malignant neoplasm of breast: Secondary | ICD-10-CM | POA: Diagnosis not present

## 2022-03-04 DIAGNOSIS — R002 Palpitations: Secondary | ICD-10-CM | POA: Diagnosis not present

## 2022-03-04 DIAGNOSIS — Z0000 Encounter for general adult medical examination without abnormal findings: Secondary | ICD-10-CM | POA: Diagnosis not present

## 2022-03-04 DIAGNOSIS — M169 Osteoarthritis of hip, unspecified: Secondary | ICD-10-CM | POA: Diagnosis not present

## 2022-03-04 DIAGNOSIS — R413 Other amnesia: Secondary | ICD-10-CM | POA: Diagnosis not present

## 2022-03-04 DIAGNOSIS — Z13220 Encounter for screening for lipoid disorders: Secondary | ICD-10-CM | POA: Diagnosis not present

## 2022-03-18 DIAGNOSIS — M5431 Sciatica, right side: Secondary | ICD-10-CM | POA: Diagnosis not present

## 2022-03-18 DIAGNOSIS — M7060 Trochanteric bursitis, unspecified hip: Secondary | ICD-10-CM | POA: Diagnosis not present

## 2022-03-18 DIAGNOSIS — M545 Low back pain, unspecified: Secondary | ICD-10-CM | POA: Diagnosis not present

## 2022-03-18 DIAGNOSIS — M169 Osteoarthritis of hip, unspecified: Secondary | ICD-10-CM | POA: Diagnosis not present

## 2022-03-18 DIAGNOSIS — Z6823 Body mass index (BMI) 23.0-23.9, adult: Secondary | ICD-10-CM | POA: Diagnosis not present

## 2022-03-30 DIAGNOSIS — N6459 Other signs and symptoms in breast: Secondary | ICD-10-CM | POA: Diagnosis not present

## 2022-03-30 DIAGNOSIS — N6489 Other specified disorders of breast: Secondary | ICD-10-CM | POA: Diagnosis not present

## 2022-03-30 DIAGNOSIS — R922 Inconclusive mammogram: Secondary | ICD-10-CM | POA: Diagnosis not present

## 2022-04-02 DIAGNOSIS — M4726 Other spondylosis with radiculopathy, lumbar region: Secondary | ICD-10-CM | POA: Diagnosis not present

## 2022-04-02 DIAGNOSIS — M48061 Spinal stenosis, lumbar region without neurogenic claudication: Secondary | ICD-10-CM | POA: Diagnosis not present

## 2022-04-02 DIAGNOSIS — M1611 Unilateral primary osteoarthritis, right hip: Secondary | ICD-10-CM | POA: Diagnosis not present

## 2022-04-02 DIAGNOSIS — M47816 Spondylosis without myelopathy or radiculopathy, lumbar region: Secondary | ICD-10-CM | POA: Diagnosis not present

## 2022-04-02 DIAGNOSIS — M5136 Other intervertebral disc degeneration, lumbar region: Secondary | ICD-10-CM | POA: Diagnosis not present

## 2022-04-02 DIAGNOSIS — M25551 Pain in right hip: Secondary | ICD-10-CM | POA: Diagnosis not present

## 2022-04-02 DIAGNOSIS — G894 Chronic pain syndrome: Secondary | ICD-10-CM | POA: Diagnosis not present

## 2022-09-02 DIAGNOSIS — M5430 Sciatica, unspecified side: Secondary | ICD-10-CM | POA: Diagnosis not present

## 2022-09-02 DIAGNOSIS — Z6823 Body mass index (BMI) 23.0-23.9, adult: Secondary | ICD-10-CM | POA: Diagnosis not present

## 2022-11-06 ENCOUNTER — Encounter (HOSPITAL_COMMUNITY): Payer: Self-pay

## 2022-11-06 DIAGNOSIS — I878 Other specified disorders of veins: Secondary | ICD-10-CM | POA: Diagnosis not present

## 2022-11-06 DIAGNOSIS — R531 Weakness: Secondary | ICD-10-CM | POA: Diagnosis not present

## 2022-11-06 DIAGNOSIS — K922 Gastrointestinal hemorrhage, unspecified: Secondary | ICD-10-CM | POA: Diagnosis not present

## 2022-11-06 DIAGNOSIS — Z4682 Encounter for fitting and adjustment of non-vascular catheter: Secondary | ICD-10-CM | POA: Diagnosis not present

## 2022-11-06 DIAGNOSIS — R112 Nausea with vomiting, unspecified: Secondary | ICD-10-CM | POA: Diagnosis not present

## 2022-11-07 DIAGNOSIS — R110 Nausea: Secondary | ICD-10-CM | POA: Diagnosis not present

## 2022-11-07 DIAGNOSIS — Z8711 Personal history of peptic ulcer disease: Secondary | ICD-10-CM | POA: Diagnosis not present

## 2022-11-07 DIAGNOSIS — G44221 Chronic tension-type headache, intractable: Secondary | ICD-10-CM | POA: Diagnosis not present

## 2022-11-07 DIAGNOSIS — Z791 Long term (current) use of non-steroidal anti-inflammatories (NSAID): Secondary | ICD-10-CM | POA: Diagnosis not present

## 2022-11-07 DIAGNOSIS — K254 Chronic or unspecified gastric ulcer with hemorrhage: Secondary | ICD-10-CM | POA: Diagnosis not present

## 2022-11-07 DIAGNOSIS — G8929 Other chronic pain: Secondary | ICD-10-CM | POA: Diagnosis not present

## 2022-11-07 DIAGNOSIS — R531 Weakness: Secondary | ICD-10-CM | POA: Diagnosis not present

## 2022-11-07 DIAGNOSIS — T39395A Adverse effect of other nonsteroidal anti-inflammatory drugs [NSAID], initial encounter: Secondary | ICD-10-CM | POA: Diagnosis not present

## 2022-11-07 DIAGNOSIS — Z7982 Long term (current) use of aspirin: Secondary | ICD-10-CM | POA: Diagnosis not present

## 2022-11-07 DIAGNOSIS — Z66 Do not resuscitate: Secondary | ICD-10-CM | POA: Diagnosis not present

## 2022-11-07 DIAGNOSIS — K922 Gastrointestinal hemorrhage, unspecified: Secondary | ICD-10-CM | POA: Diagnosis not present

## 2022-11-07 DIAGNOSIS — D62 Acute posthemorrhagic anemia: Secondary | ICD-10-CM | POA: Diagnosis not present

## 2022-11-07 DIAGNOSIS — K259 Gastric ulcer, unspecified as acute or chronic, without hemorrhage or perforation: Secondary | ICD-10-CM | POA: Diagnosis not present

## 2022-11-07 DIAGNOSIS — F32A Depression, unspecified: Secondary | ICD-10-CM | POA: Diagnosis not present

## 2022-11-07 DIAGNOSIS — R519 Headache, unspecified: Secondary | ICD-10-CM | POA: Diagnosis not present

## 2022-11-07 DIAGNOSIS — K920 Hematemesis: Secondary | ICD-10-CM | POA: Diagnosis not present

## 2022-11-07 DIAGNOSIS — E876 Hypokalemia: Secondary | ICD-10-CM | POA: Diagnosis not present

## 2022-11-07 DIAGNOSIS — M25551 Pain in right hip: Secondary | ICD-10-CM | POA: Diagnosis not present

## 2022-11-07 DIAGNOSIS — Z8669 Personal history of other diseases of the nervous system and sense organs: Secondary | ICD-10-CM | POA: Diagnosis not present

## 2022-11-07 DIAGNOSIS — M1990 Unspecified osteoarthritis, unspecified site: Secondary | ICD-10-CM | POA: Diagnosis not present

## 2022-11-07 DIAGNOSIS — E785 Hyperlipidemia, unspecified: Secondary | ICD-10-CM | POA: Diagnosis not present

## 2022-11-07 DIAGNOSIS — I10 Essential (primary) hypertension: Secondary | ICD-10-CM | POA: Diagnosis not present

## 2022-11-07 DIAGNOSIS — K2950 Unspecified chronic gastritis without bleeding: Secondary | ICD-10-CM | POA: Diagnosis not present

## 2022-11-10 DIAGNOSIS — K922 Gastrointestinal hemorrhage, unspecified: Secondary | ICD-10-CM | POA: Diagnosis not present

## 2022-11-10 DIAGNOSIS — D62 Acute posthemorrhagic anemia: Secondary | ICD-10-CM | POA: Diagnosis not present

## 2022-11-11 DIAGNOSIS — K922 Gastrointestinal hemorrhage, unspecified: Secondary | ICD-10-CM | POA: Diagnosis not present

## 2022-11-11 DIAGNOSIS — D62 Acute posthemorrhagic anemia: Secondary | ICD-10-CM | POA: Diagnosis not present

## 2022-11-18 DIAGNOSIS — M545 Low back pain, unspecified: Secondary | ICD-10-CM | POA: Diagnosis not present

## 2022-11-18 DIAGNOSIS — K922 Gastrointestinal hemorrhage, unspecified: Secondary | ICD-10-CM | POA: Diagnosis not present

## 2022-11-18 DIAGNOSIS — M169 Osteoarthritis of hip, unspecified: Secondary | ICD-10-CM | POA: Diagnosis not present

## 2022-11-18 DIAGNOSIS — G43909 Migraine, unspecified, not intractable, without status migrainosus: Secondary | ICD-10-CM | POA: Diagnosis not present

## 2022-11-18 DIAGNOSIS — D649 Anemia, unspecified: Secondary | ICD-10-CM | POA: Diagnosis not present

## 2022-11-18 DIAGNOSIS — Z6822 Body mass index (BMI) 22.0-22.9, adult: Secondary | ICD-10-CM | POA: Diagnosis not present

## 2022-11-23 ENCOUNTER — Encounter (INDEPENDENT_AMBULATORY_CARE_PROVIDER_SITE_OTHER): Payer: Self-pay | Admitting: *Deleted

## 2022-12-02 DIAGNOSIS — D649 Anemia, unspecified: Secondary | ICD-10-CM | POA: Diagnosis not present

## 2022-12-07 DIAGNOSIS — D529 Folate deficiency anemia, unspecified: Secondary | ICD-10-CM | POA: Diagnosis not present

## 2022-12-07 DIAGNOSIS — D649 Anemia, unspecified: Secondary | ICD-10-CM | POA: Diagnosis not present

## 2022-12-07 DIAGNOSIS — D519 Vitamin B12 deficiency anemia, unspecified: Secondary | ICD-10-CM | POA: Diagnosis not present

## 2022-12-08 DIAGNOSIS — M129 Arthropathy, unspecified: Secondary | ICD-10-CM | POA: Diagnosis not present

## 2022-12-08 DIAGNOSIS — M545 Low back pain, unspecified: Secondary | ICD-10-CM | POA: Diagnosis not present

## 2022-12-08 DIAGNOSIS — G894 Chronic pain syndrome: Secondary | ICD-10-CM | POA: Diagnosis not present

## 2022-12-08 DIAGNOSIS — E559 Vitamin D deficiency, unspecified: Secondary | ICD-10-CM | POA: Diagnosis not present

## 2022-12-08 DIAGNOSIS — M169 Osteoarthritis of hip, unspecified: Secondary | ICD-10-CM | POA: Diagnosis not present

## 2022-12-08 DIAGNOSIS — Z79899 Other long term (current) drug therapy: Secondary | ICD-10-CM | POA: Diagnosis not present

## 2022-12-11 DIAGNOSIS — M169 Osteoarthritis of hip, unspecified: Secondary | ICD-10-CM | POA: Diagnosis not present

## 2022-12-11 DIAGNOSIS — D649 Anemia, unspecified: Secondary | ICD-10-CM | POA: Diagnosis not present

## 2022-12-11 DIAGNOSIS — G43909 Migraine, unspecified, not intractable, without status migrainosus: Secondary | ICD-10-CM | POA: Diagnosis not present

## 2022-12-11 DIAGNOSIS — M545 Low back pain, unspecified: Secondary | ICD-10-CM | POA: Diagnosis not present

## 2022-12-11 DIAGNOSIS — K922 Gastrointestinal hemorrhage, unspecified: Secondary | ICD-10-CM | POA: Diagnosis not present

## 2022-12-21 DIAGNOSIS — G894 Chronic pain syndrome: Secondary | ICD-10-CM | POA: Diagnosis not present

## 2022-12-21 DIAGNOSIS — Z79899 Other long term (current) drug therapy: Secondary | ICD-10-CM | POA: Diagnosis not present

## 2022-12-21 DIAGNOSIS — R030 Elevated blood-pressure reading, without diagnosis of hypertension: Secondary | ICD-10-CM | POA: Diagnosis not present

## 2022-12-21 DIAGNOSIS — M169 Osteoarthritis of hip, unspecified: Secondary | ICD-10-CM | POA: Diagnosis not present

## 2022-12-21 DIAGNOSIS — M545 Low back pain, unspecified: Secondary | ICD-10-CM | POA: Diagnosis not present

## 2022-12-24 ENCOUNTER — Ambulatory Visit (INDEPENDENT_AMBULATORY_CARE_PROVIDER_SITE_OTHER): Payer: Medicare Other | Admitting: Gastroenterology

## 2023-01-14 DIAGNOSIS — M5416 Radiculopathy, lumbar region: Secondary | ICD-10-CM | POA: Diagnosis not present

## 2023-01-14 DIAGNOSIS — Z6823 Body mass index (BMI) 23.0-23.9, adult: Secondary | ICD-10-CM | POA: Diagnosis not present

## 2023-01-14 DIAGNOSIS — M545 Low back pain, unspecified: Secondary | ICD-10-CM | POA: Diagnosis not present

## 2023-01-14 DIAGNOSIS — M5431 Sciatica, right side: Secondary | ICD-10-CM | POA: Diagnosis not present

## 2023-01-14 DIAGNOSIS — R030 Elevated blood-pressure reading, without diagnosis of hypertension: Secondary | ICD-10-CM | POA: Diagnosis not present

## 2023-01-18 DIAGNOSIS — Z79899 Other long term (current) drug therapy: Secondary | ICD-10-CM | POA: Diagnosis not present

## 2023-01-18 DIAGNOSIS — R030 Elevated blood-pressure reading, without diagnosis of hypertension: Secondary | ICD-10-CM | POA: Diagnosis not present

## 2023-01-18 DIAGNOSIS — M169 Osteoarthritis of hip, unspecified: Secondary | ICD-10-CM | POA: Diagnosis not present

## 2023-01-18 DIAGNOSIS — G894 Chronic pain syndrome: Secondary | ICD-10-CM | POA: Diagnosis not present

## 2023-01-18 DIAGNOSIS — M545 Low back pain, unspecified: Secondary | ICD-10-CM | POA: Diagnosis not present

## 2023-02-25 DIAGNOSIS — M5431 Sciatica, right side: Secondary | ICD-10-CM | POA: Diagnosis not present

## 2023-02-25 DIAGNOSIS — M169 Osteoarthritis of hip, unspecified: Secondary | ICD-10-CM | POA: Diagnosis not present

## 2023-02-25 DIAGNOSIS — M545 Low back pain, unspecified: Secondary | ICD-10-CM | POA: Diagnosis not present

## 2023-02-25 DIAGNOSIS — Z6823 Body mass index (BMI) 23.0-23.9, adult: Secondary | ICD-10-CM | POA: Diagnosis not present

## 2023-02-25 DIAGNOSIS — R030 Elevated blood-pressure reading, without diagnosis of hypertension: Secondary | ICD-10-CM | POA: Diagnosis not present

## 2023-03-15 DIAGNOSIS — E782 Mixed hyperlipidemia: Secondary | ICD-10-CM | POA: Diagnosis not present

## 2023-03-15 DIAGNOSIS — Z1231 Encounter for screening mammogram for malignant neoplasm of breast: Secondary | ICD-10-CM | POA: Diagnosis not present

## 2023-03-15 DIAGNOSIS — M461 Sacroiliitis, not elsewhere classified: Secondary | ICD-10-CM | POA: Diagnosis not present

## 2023-03-15 DIAGNOSIS — R030 Elevated blood-pressure reading, without diagnosis of hypertension: Secondary | ICD-10-CM | POA: Diagnosis not present

## 2023-03-15 DIAGNOSIS — R413 Other amnesia: Secondary | ICD-10-CM | POA: Diagnosis not present

## 2023-03-15 DIAGNOSIS — Z0000 Encounter for general adult medical examination without abnormal findings: Secondary | ICD-10-CM | POA: Diagnosis not present

## 2023-03-15 DIAGNOSIS — M169 Osteoarthritis of hip, unspecified: Secondary | ICD-10-CM | POA: Diagnosis not present

## 2023-03-15 DIAGNOSIS — Z0001 Encounter for general adult medical examination with abnormal findings: Secondary | ICD-10-CM | POA: Diagnosis not present

## 2023-03-15 DIAGNOSIS — Z6822 Body mass index (BMI) 22.0-22.9, adult: Secondary | ICD-10-CM | POA: Diagnosis not present

## 2023-03-15 DIAGNOSIS — M545 Low back pain, unspecified: Secondary | ICD-10-CM | POA: Diagnosis not present

## 2023-03-15 DIAGNOSIS — G43909 Migraine, unspecified, not intractable, without status migrainosus: Secondary | ICD-10-CM | POA: Diagnosis not present

## 2023-03-15 DIAGNOSIS — E559 Vitamin D deficiency, unspecified: Secondary | ICD-10-CM | POA: Diagnosis not present

## 2023-03-16 DIAGNOSIS — I10 Essential (primary) hypertension: Secondary | ICD-10-CM | POA: Diagnosis not present

## 2023-03-17 ENCOUNTER — Other Ambulatory Visit: Payer: Self-pay

## 2023-03-17 ENCOUNTER — Emergency Department (HOSPITAL_COMMUNITY): Payer: Medicare Other

## 2023-03-17 ENCOUNTER — Emergency Department (HOSPITAL_COMMUNITY)
Admission: EM | Admit: 2023-03-17 | Discharge: 2023-03-18 | Disposition: A | Discharge status: Home / Self Care | Payer: Medicare Other | Attending: Emergency Medicine | Admitting: Emergency Medicine

## 2023-03-17 ENCOUNTER — Encounter (HOSPITAL_COMMUNITY): Payer: Self-pay | Admitting: *Deleted

## 2023-03-17 DIAGNOSIS — I10 Essential (primary) hypertension: Secondary | ICD-10-CM | POA: Diagnosis not present

## 2023-03-17 DIAGNOSIS — Z79899 Other long term (current) drug therapy: Secondary | ICD-10-CM | POA: Insufficient documentation

## 2023-03-17 DIAGNOSIS — R1110 Vomiting, unspecified: Secondary | ICD-10-CM | POA: Diagnosis not present

## 2023-03-17 DIAGNOSIS — H539 Unspecified visual disturbance: Secondary | ICD-10-CM | POA: Diagnosis not present

## 2023-03-17 DIAGNOSIS — G894 Chronic pain syndrome: Secondary | ICD-10-CM | POA: Insufficient documentation

## 2023-03-17 LAB — COMPREHENSIVE METABOLIC PANEL
ALT: 17 U/L (ref 0–44)
AST: 23 U/L (ref 15–41)
Albumin: 4.8 g/dL (ref 3.5–5.0)
Alkaline Phosphatase: 75 U/L (ref 38–126)
Anion gap: 11 (ref 5–15)
BUN: 17 mg/dL (ref 8–23)
CO2: 24 mmol/L (ref 22–32)
Calcium: 9.7 mg/dL (ref 8.9–10.3)
Chloride: 103 mmol/L (ref 98–111)
Creatinine, Ser: 0.79 mg/dL (ref 0.44–1.00)
GFR, Estimated: >60 mL/min (ref >60)
Glucose, Bld: 98 mg/dL (ref 70–99)
Potassium: 3.3 mmol/L — ABNORMAL LOW (ref 3.5–5.1)
Sodium: 138 mmol/L (ref 135–145)
Total Bilirubin: 0.6 mg/dL (ref 0.3–1.2)
Total Protein: 8 g/dL (ref 6.5–8.1)

## 2023-03-17 LAB — CBC WITH DIFFERENTIAL/PLATELET
Abs Immature Granulocytes: 0.01 10*3/uL (ref 0.00–0.07)
Basophils Absolute: 0 10*3/uL (ref 0.0–0.1)
Basophils Relative: 1 %
Eosinophils Absolute: 0 10*3/uL (ref 0.0–0.5)
Eosinophils Relative: 1 %
HCT: 45.7 % (ref 36.0–46.0)
Hemoglobin: 15.2 g/dL — ABNORMAL HIGH (ref 12.0–15.0)
Immature Granulocytes: 0 %
Lymphocytes Relative: 38 %
Lymphs Abs: 2.5 10*3/uL (ref 0.7–4.0)
MCH: 28.1 pg (ref 26.0–34.0)
MCHC: 33.3 g/dL (ref 30.0–36.0)
MCV: 84.6 fL (ref 80.0–100.0)
Monocytes Absolute: 0.4 10*3/uL (ref 0.1–1.0)
Monocytes Relative: 6 %
Neutro Abs: 3.6 10*3/uL (ref 1.7–7.7)
Neutrophils Relative %: 54 %
Platelets: 226 10*3/uL (ref 150–400)
RBC: 5.4 MIL/uL — ABNORMAL HIGH (ref 3.87–5.11)
RDW: 16.6 % — ABNORMAL HIGH (ref 11.5–15.5)
WBC: 6.6 10*3/uL (ref 4.0–10.5)
nRBC: 0 % (ref 0.0–0.2)

## 2023-03-17 MED ORDER — HYDROMORPHONE HCL 1 MG/ML IJ SOLN
1.0000 mg | Freq: Once | INTRAMUSCULAR | Status: AC
  Administered 2023-03-17: 1 mg via INTRAVENOUS
  Filled 2023-03-17: qty 1

## 2023-03-17 MED ORDER — ONDANSETRON HCL 4 MG/2ML IJ SOLN
4.0000 mg | Freq: Once | INTRAMUSCULAR | Status: AC
  Administered 2023-03-17: 4 mg via INTRAVENOUS
  Filled 2023-03-17: qty 2

## 2023-03-17 MED ORDER — SODIUM CHLORIDE 0.9 % IV SOLN: INTRAVENOUS | Status: DC

## 2023-03-17 NOTE — ED Triage Notes (Signed)
Pt c/o blurry vision that just started

## 2023-03-17 NOTE — ED Provider Notes (Signed)
[place] EMERGENCY DEPARTMENT AT [facility] Provider Note   CSN: [id number] Arrival date & time: 03/17/23  1708     History {Add pertinent medical, surgical, social history, OB history to HPI:1} Chief Complaint  Patient presents with   Emesis    Audrey Bird is a 62 y.o. female.  HPI     Home Medications Prior to Admission medications   Medication Sig Start Date End Date Taking? Authorizing Provider  amoxicillin (AMOXIL) 500 MG capsule Take 500 mg by mouth daily. Pt takes when her tooth hurts 02/17/23  Yes [provider]  Cholecalciferol (VITAMIN D-3) 1000 units CAPS Take 1,000 Units by mouth daily.    Yes [provider]  cyclobenzaprine (FLEXERIL) 10 MG tablet Take 1 tablet (10 mg total) by mouth 3 (three) times daily as needed for muscle spasms. 02/18/17  Yes Pool, Sherilyn Cooter, MD  lidocaine (LIDODERM) 5 % Place onto the skin.   Yes [provider]  Multiple Vitamin (MULITIVITAMIN WITH MINERALS) TABS Take 1 tablet by mouth daily.   Yes [provider]  naloxone (NARCAN) nasal spray 4 mg/0.1 mL Place 1 spray into the nose once. 12/21/22  Yes [provider]  Omega-3 Fatty Acids (FISH OIL) 1000 MG CAPS Take 2,000 mg by mouth daily.   Yes [provider]  OVER THE COUNTER MEDICATION Apply 1 Application topically as needed (pain). [place] gel   Yes [provider]  pantoprazole (PROTONIX) 40 MG tablet Take 40 mg by mouth 2 (two) times daily.   Yes [provider]  propranolol ER (INDERAL LA) 60 MG 24 hr capsule Take 60 mg by mouth daily. 03/16/23  Yes [provider]  SUMAtriptan (IMITREX) 100 MG tablet Take 100 mg by mouth as directed. 03/16/23  Yes [provider]  predniSONE (DELTASONE) 10 MG tablet Take 6 tablets day one, 5 tablets day two, 4 tablets day three, 3 tablets day four, 2 tablets day five, then 1 tablet day six Patient not taking: Reported on 01/19/2017 11/22/16   Burgess Amor,  PA-C  progesterone (PROMETRIUM) 100 MG capsule Take 100 mg by mouth at bedtime.  Patient not taking: Reported on 03/17/2023 11/26/16   [provider]  sucralfate (CARAFATE) 1 g tablet Take by mouth. Patient not taking: Reported on 03/17/2023 11/11/22   [provider]  traMADol (ULTRAM) 50 MG tablet Take 50 mg by mouth every 6 (six) hours as needed. Patient not taking: Reported on 03/17/2023 11/18/22   [provider]  UNABLE TO FIND Apply 1 application topically 3 (three) times daily as needed (pain). [place] Gel  Gets from Economist, Historical, MD      Allergies    No known allergies    Review of Systems   Review of Systems  Physical Exam Updated Vital Signs BP (!) 148/94   Pulse 79   Temp 98 F (36.7 C) (Oral)   Resp 20   Ht 1.676 m (5\' 6" )   Wt 63.5 kg   SpO2 97%   BMI 22.60 kg/m  Physical Exam  ED Results / Procedures / Treatments   Labs (all labs ordered are listed, but only abnormal results are displayed) Labs Reviewed - No data to display  EKG EKG Interpretation  Date/Time:  Wednesday March 17 2023 17:30:33 EDT Ventricular Rate:  92 PR Interval:  133 QRS Duration: 105 QT Interval:  377 QTC Calculation: 467 R Axis:   58 Text Interpretation: Sinus rhythm Borderline T  wave abnormalities Baseline wander in lead(s) II III aVF V1 V4 V5 V6 Confirmed by Vanetta Mulders [phone]) on 03/17/2023 7:16:21 PM  Radiology No results found.  Procedures Procedures  {Document cardiac monitor, telemetry assessment procedure when appropriate:1}  Medications Ordered in ED Medications  ondansetron (ZOFRAN) injection 4 mg (4 mg Intravenous Given 03/17/23 1843)    ED Course/ Medical Decision Making/ A&P   {   Click here for ABCD2, HEART and other calculatorsREFRESH Note before signing :1}                          Medical Decision Making  ***  {Document critical care time when appropriate:1} {Document review of labs and clinical decision  tools ie heart score, Chads2Vasc2 etc:1}  {Document your independent review of radiology images, and any outside records:1} {Document your discussion with family members, caretakers, and with consultants:1} {Document social determinants of health affecting pt's care:1} {Document your decision making why or why not admission, treatments were needed:1} Final Clinical Impression(s) / ED Diagnoses Final diagnoses:  None    Rx / DC Orders ED Discharge Orders     None

## 2023-03-17 NOTE — ED Notes (Signed)
ED Provider at bedside. 

## 2023-03-17 NOTE — ED Triage Notes (Signed)
Pt c/o vomiting that started today and states she has been having high blood pressure readings for the last 3 days; pt states she started taking BP meds last night  Pt c/o numbness and tingling to fingertips and toes

## 2023-03-17 NOTE — ED Notes (Signed)
Pt ambulated to the restroom with assistance at this time obtaining US sample at this time.Marland Kitchen Audrey Bird

## 2023-03-18 NOTE — Discharge Instructions (Signed)
Follow-up with your regular doctor to have blood pressure rechecked and trended blood pressure improved significantly here with treatment of the pain.  Follow-up with your new pain management doctor.  Today's potassium was just a little off normal at 3.3 does not require any specific adjustment.  Otherwise labs were normal.  Head CT negative and chest x-ray negative return for any new or worse symptoms.

## 2023-07-15 DIAGNOSIS — H8110 Benign paroxysmal vertigo, unspecified ear: Secondary | ICD-10-CM | POA: Diagnosis not present

## 2023-07-15 DIAGNOSIS — Z6823 Body mass index (BMI) 23.0-23.9, adult: Secondary | ICD-10-CM | POA: Diagnosis not present

## 2023-09-08 DIAGNOSIS — R030 Elevated blood-pressure reading, without diagnosis of hypertension: Secondary | ICD-10-CM | POA: Diagnosis not present

## 2023-09-08 DIAGNOSIS — Z6823 Body mass index (BMI) 23.0-23.9, adult: Secondary | ICD-10-CM | POA: Diagnosis not present

## 2023-09-08 DIAGNOSIS — M545 Low back pain, unspecified: Secondary | ICD-10-CM | POA: Diagnosis not present

## 2023-09-08 DIAGNOSIS — M5416 Radiculopathy, lumbar region: Secondary | ICD-10-CM | POA: Diagnosis not present

## 2023-09-08 DIAGNOSIS — M25551 Pain in right hip: Secondary | ICD-10-CM | POA: Diagnosis not present

## 2023-11-15 ENCOUNTER — Encounter: Payer: Self-pay | Admitting: Physical Medicine & Rehabilitation

## 2023-11-23 DIAGNOSIS — M169 Osteoarthritis of hip, unspecified: Secondary | ICD-10-CM | POA: Diagnosis not present

## 2023-11-23 DIAGNOSIS — M5431 Sciatica, right side: Secondary | ICD-10-CM | POA: Diagnosis not present

## 2023-11-23 DIAGNOSIS — M5416 Radiculopathy, lumbar region: Secondary | ICD-10-CM | POA: Diagnosis not present

## 2023-11-23 DIAGNOSIS — Z6822 Body mass index (BMI) 22.0-22.9, adult: Secondary | ICD-10-CM | POA: Diagnosis not present

## 2023-11-30 ENCOUNTER — Encounter: Payer: Self-pay | Admitting: Physical Medicine & Rehabilitation

## 2023-11-30 ENCOUNTER — Encounter: Payer: Medicare Other | Attending: Physical Medicine & Rehabilitation | Admitting: Physical Medicine & Rehabilitation

## 2023-11-30 VITALS — BP 160/95 | HR 88 | Ht 66.0 in | Wt 143.0 lb

## 2023-11-30 DIAGNOSIS — G894 Chronic pain syndrome: Secondary | ICD-10-CM | POA: Insufficient documentation

## 2023-11-30 DIAGNOSIS — Z5181 Encounter for therapeutic drug level monitoring: Secondary | ICD-10-CM | POA: Insufficient documentation

## 2023-11-30 DIAGNOSIS — G8929 Other chronic pain: Secondary | ICD-10-CM | POA: Insufficient documentation

## 2023-11-30 DIAGNOSIS — Z79899 Other long term (current) drug therapy: Secondary | ICD-10-CM | POA: Diagnosis not present

## 2023-11-30 DIAGNOSIS — M5441 Lumbago with sciatica, right side: Secondary | ICD-10-CM | POA: Insufficient documentation

## 2023-11-30 DIAGNOSIS — M25551 Pain in right hip: Secondary | ICD-10-CM | POA: Diagnosis not present
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
## 2023-12-04 LAB — TOXASSURE SELECT,+ANTIDEPR,UR

## 2023-12-07 ENCOUNTER — Ambulatory Visit (HOSPITAL_COMMUNITY)
Admission: RE | Admit: 2023-12-07 | Discharge: 2023-12-07 | Disposition: A | Discharge status: Home / Self Care | Payer: Medicare Other | Source: Ambulatory Visit | Attending: Physical Medicine & Rehabilitation | Admitting: Physical Medicine & Rehabilitation

## 2023-12-07 DIAGNOSIS — M5441 Lumbago with sciatica, right side: Secondary | ICD-10-CM | POA: Insufficient documentation

## 2023-12-07 DIAGNOSIS — G8929 Other chronic pain: Secondary | ICD-10-CM | POA: Diagnosis not present

## 2023-12-07 DIAGNOSIS — M48061 Spinal stenosis, lumbar region without neurogenic claudication: Secondary | ICD-10-CM | POA: Diagnosis not present

## 2023-12-07 DIAGNOSIS — M47816 Spondylosis without myelopathy or radiculopathy, lumbar region: Secondary | ICD-10-CM | POA: Diagnosis not present

## 2023-12-07 DIAGNOSIS — M5126 Other intervertebral disc displacement, lumbar region: Secondary | ICD-10-CM | POA: Diagnosis not present

## 2023-12-15 ENCOUNTER — Telehealth: Payer: Self-pay | Admitting: Physical Medicine & Rehabilitation

## 2023-12-15 NOTE — Telephone Encounter (Signed)
 Patient needing refill on Gabapentin, she took her last one this morning.

## 2023-12-16 MED ORDER — GABAPENTIN 600 MG PO TABS: 600.0000 mg | ORAL_TABLET | Freq: Three times a day (TID) | ORAL | 3 refills | Status: DC

## 2023-12-16 NOTE — Addendum Note (Signed)
 Addended by: Fanny Dance on: 12/16/2023 02:45 PM   Modules accepted: Orders

## 2024-01-04 ENCOUNTER — Encounter: Payer: Self-pay | Admitting: Physical Medicine & Rehabilitation

## 2024-01-04 ENCOUNTER — Encounter: Payer: Medicare Other | Attending: Physical Medicine & Rehabilitation | Admitting: Physical Medicine & Rehabilitation

## 2024-01-04 VITALS — BP 184/95 | HR 75 | Ht 66.0 in | Wt 144.0 lb

## 2024-01-04 DIAGNOSIS — Z5181 Encounter for therapeutic drug level monitoring: Secondary | ICD-10-CM | POA: Diagnosis not present

## 2024-01-04 DIAGNOSIS — M25551 Pain in right hip: Secondary | ICD-10-CM | POA: Insufficient documentation

## 2024-01-04 DIAGNOSIS — M5441 Lumbago with sciatica, right side: Secondary | ICD-10-CM | POA: Insufficient documentation

## 2024-01-04 DIAGNOSIS — G8929 Other chronic pain: Secondary | ICD-10-CM | POA: Insufficient documentation

## 2024-01-04 DIAGNOSIS — Z79899 Other long term (current) drug therapy: Secondary | ICD-10-CM | POA: Insufficient documentation

## 2024-01-04 DIAGNOSIS — G894 Chronic pain syndrome: Secondary | ICD-10-CM | POA: Diagnosis not present

## 2024-01-04 MED ORDER — GABAPENTIN 800 MG PO TABS: 800.0000 mg | ORAL_TABLET | Freq: Three times a day (TID) | ORAL | 3 refills | Status: DC

## 2024-01-04 MED ORDER — HYDROCODONE-ACETAMINOPHEN 5-325 MG PO TABS: 1.0000 | ORAL_TABLET | Freq: Every day | ORAL | 0 refills | Status: DC
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
## 2024-01-14 ENCOUNTER — Encounter: Attending: Physical Medicine & Rehabilitation | Admitting: Physical Medicine & Rehabilitation

## 2024-01-14 ENCOUNTER — Encounter: Payer: Self-pay | Admitting: Physical Medicine & Rehabilitation

## 2024-01-14 VITALS — BP 160/97 | HR 76 | Ht 66.0 in | Wt 143.2 lb

## 2024-01-14 DIAGNOSIS — M1651 Unilateral post-traumatic osteoarthritis, right hip: Secondary | ICD-10-CM

## 2024-01-14 MED ORDER — BETAMETHASONE SOD PHOS & ACET 6 (3-3) MG/ML IJ SUSP
6.0000 mg | Freq: Once | INTRAMUSCULAR | Status: AC
Start: 2024-01-14 — End: 2024-01-14
  Administered 2024-01-14: 6 mg via INTRA_ARTICULAR

## 2024-01-14 MED ORDER — LIDOCAINE HCL 1 % IJ SOLN
4.0000 mL | Freq: Once | INTRAMUSCULAR | Status: AC
Start: 2024-01-14 — End: 2024-01-14
  Administered 2024-01-14: 4 mL

## 2024-01-14 NOTE — Progress Notes (Signed)
 RIGHT  hip intra-articular injection under ultrasound guidance  Indication osteoarthritis unresponsive to conservative care including exercise and oral medications  Informed consent was obtained after describing risks and benefits of the procedure, this includes bleeding bruising and infection. The patient elected to proceed and has given written consent Patient placed supine on exam table. 4 Hz curvilinear transducer utilized. Femur was identified distally and followed proximally to the femoral neck. Area was marked and prepped with Betadine. 25-gauge 1.5 inch needle was utilized to anesthetize the skin and subcutaneous tissue with 3 cc of 1% lidocaine. Then a 22-gauge ECHO block 80 mm needle was inserted under direct ultrasound visualization, targeting the junction of the femoral head and femoral neck. Bone contact was made. Then a solution containing 1.5 mL of 6 mg per mL/tone and 3.5 ML of 1% lidocaine were injected after negative drawback for blood. Patient tolerated procedure well. Post procedure instructions given.

## 2024-01-14 NOTE — Patient Instructions (Signed)
 Hip injection under ultrasound guidance on the right side. Injected Celestone and lidocaine.  This can be done every 3 months as needed.  Follow-up with Dr. Benjie Karvonen next month and discuss results of this injection

## 2024-01-18 DIAGNOSIS — M5416 Radiculopathy, lumbar region: Secondary | ICD-10-CM | POA: Diagnosis not present

## 2024-01-18 DIAGNOSIS — M5431 Sciatica, right side: Secondary | ICD-10-CM | POA: Diagnosis not present

## 2024-01-18 DIAGNOSIS — M169 Osteoarthritis of hip, unspecified: Secondary | ICD-10-CM | POA: Diagnosis not present

## 2024-01-18 DIAGNOSIS — Z6823 Body mass index (BMI) 23.0-23.9, adult: Secondary | ICD-10-CM | POA: Diagnosis not present

## 2024-01-18 DIAGNOSIS — I10 Essential (primary) hypertension: Secondary | ICD-10-CM | POA: Diagnosis not present

## 2024-03-02 ENCOUNTER — Encounter: Payer: Self-pay | Attending: Physical Medicine & Rehabilitation | Admitting: Physical Medicine & Rehabilitation

## 2024-03-02 ENCOUNTER — Encounter: Payer: Self-pay | Admitting: Physical Medicine & Rehabilitation

## 2024-03-02 VITALS — BP 163/108 | HR 86 | Ht 66.0 in | Wt 144.0 lb

## 2024-03-02 DIAGNOSIS — M1651 Unilateral post-traumatic osteoarthritis, right hip: Secondary | ICD-10-CM

## 2024-03-02 DIAGNOSIS — G8929 Other chronic pain: Secondary | ICD-10-CM

## 2024-03-02 DIAGNOSIS — M5441 Lumbago with sciatica, right side: Secondary | ICD-10-CM | POA: Diagnosis not present

## 2024-03-02 DIAGNOSIS — Z79899 Other long term (current) drug therapy: Secondary | ICD-10-CM | POA: Diagnosis not present

## 2024-03-02 DIAGNOSIS — M25551 Pain in right hip: Secondary | ICD-10-CM

## 2024-03-02 MED ORDER — HYDROCODONE-ACETAMINOPHEN 7.5-325 MG PO TABS: 1.0000 | ORAL_TABLET | Freq: Two times a day (BID) | ORAL | 0 refills | Status: DC | PRN

## 2024-03-02 MED ORDER — GABAPENTIN 600 MG PO TABS: 600.0000 mg | ORAL_TABLET | Freq: Three times a day (TID) | ORAL | 3 refills | Status: DC
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
## 2024-04-06 DIAGNOSIS — Z0001 Encounter for general adult medical examination with abnormal findings: Secondary | ICD-10-CM | POA: Diagnosis not present

## 2024-04-06 DIAGNOSIS — M169 Osteoarthritis of hip, unspecified: Secondary | ICD-10-CM | POA: Diagnosis not present

## 2024-04-06 DIAGNOSIS — Z1389 Encounter for screening for other disorder: Secondary | ICD-10-CM | POA: Diagnosis not present

## 2024-04-06 DIAGNOSIS — I10 Essential (primary) hypertension: Secondary | ICD-10-CM | POA: Diagnosis not present

## 2024-04-06 DIAGNOSIS — Z6823 Body mass index (BMI) 23.0-23.9, adult: Secondary | ICD-10-CM | POA: Diagnosis not present

## 2024-04-06 DIAGNOSIS — Z0000 Encounter for general adult medical examination without abnormal findings: Secondary | ICD-10-CM | POA: Diagnosis not present

## 2024-04-06 DIAGNOSIS — R5383 Other fatigue: Secondary | ICD-10-CM | POA: Diagnosis not present

## 2024-04-11 ENCOUNTER — Encounter: Admitting: Physical Medicine & Rehabilitation

## 2024-04-11 NOTE — Addendum Note (Signed)
 Addended by: Kyesha Balla on: 04/11/2024 09:39 AM   Modules accepted: Orders

## 2024-04-27 ENCOUNTER — Encounter: Attending: Physical Medicine & Rehabilitation | Admitting: Physical Medicine & Rehabilitation

## 2024-04-27 ENCOUNTER — Encounter: Payer: Self-pay | Admitting: Physical Medicine & Rehabilitation

## 2024-04-27 VITALS — BP 152/97 | HR 91 | Ht 66.0 in | Wt 143.4 lb

## 2024-04-27 DIAGNOSIS — M5441 Lumbago with sciatica, right side: Secondary | ICD-10-CM | POA: Insufficient documentation

## 2024-04-27 DIAGNOSIS — G8929 Other chronic pain: Secondary | ICD-10-CM | POA: Insufficient documentation

## 2024-04-27 DIAGNOSIS — Z5181 Encounter for therapeutic drug level monitoring: Secondary | ICD-10-CM | POA: Insufficient documentation

## 2024-04-27 DIAGNOSIS — M1651 Unilateral post-traumatic osteoarthritis, right hip: Secondary | ICD-10-CM | POA: Diagnosis not present

## 2024-04-27 MED ORDER — HYDROCODONE-ACETAMINOPHEN 7.5-325 MG PO TABS: 1.0000 | ORAL_TABLET | Freq: Two times a day (BID) | ORAL | 0 refills | Status: AC | PRN
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
## 2024-05-26 DIAGNOSIS — M169 Osteoarthritis of hip, unspecified: Secondary | ICD-10-CM | POA: Diagnosis not present

## 2024-05-26 DIAGNOSIS — Z6824 Body mass index (BMI) 24.0-24.9, adult: Secondary | ICD-10-CM | POA: Diagnosis not present

## 2024-05-26 DIAGNOSIS — I10 Essential (primary) hypertension: Secondary | ICD-10-CM | POA: Diagnosis not present

## 2024-05-29 ENCOUNTER — Telehealth: Payer: Self-pay | Admitting: Physical Medicine & Rehabilitation

## 2024-05-29 NOTE — Telephone Encounter (Signed)
 Patients Husband called stating that the place Audrey Bird was referred to does not do nerve block procedures and would like to speak with someone so she can be referred to another facility that does nerve block. She states she has been waiting almost 2 months now.
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
## 2024-06-07 DIAGNOSIS — M25551 Pain in right hip: Secondary | ICD-10-CM | POA: Diagnosis not present

## 2024-06-07 DIAGNOSIS — M5417 Radiculopathy, lumbosacral region: Secondary | ICD-10-CM | POA: Diagnosis not present

## 2024-06-07 DIAGNOSIS — Z5181 Encounter for therapeutic drug level monitoring: Secondary | ICD-10-CM | POA: Diagnosis not present

## 2024-06-07 DIAGNOSIS — Z79899 Other long term (current) drug therapy: Secondary | ICD-10-CM | POA: Diagnosis not present

## 2024-06-07 DIAGNOSIS — G894 Chronic pain syndrome: Secondary | ICD-10-CM | POA: Diagnosis not present

## 2024-06-22 ENCOUNTER — Encounter: Attending: Physical Medicine & Rehabilitation | Admitting: Physical Medicine & Rehabilitation

## 2024-06-22 DIAGNOSIS — M1651 Unilateral post-traumatic osteoarthritis, right hip: Secondary | ICD-10-CM | POA: Insufficient documentation

## 2024-06-22 DIAGNOSIS — Z5181 Encounter for therapeutic drug level monitoring: Secondary | ICD-10-CM | POA: Insufficient documentation

## 2024-06-22 DIAGNOSIS — M5441 Lumbago with sciatica, right side: Secondary | ICD-10-CM | POA: Insufficient documentation

## 2024-06-22 DIAGNOSIS — G8929 Other chronic pain: Secondary | ICD-10-CM | POA: Insufficient documentation

## 2024-06-25 ENCOUNTER — Other Ambulatory Visit: Payer: Self-pay | Admitting: Physical Medicine & Rehabilitation

## 2024-07-26 ENCOUNTER — Encounter (INDEPENDENT_AMBULATORY_CARE_PROVIDER_SITE_OTHER): Payer: Self-pay | Admitting: Gastroenterology

## 2024-08-01 DIAGNOSIS — M25551 Pain in right hip: Secondary | ICD-10-CM | POA: Diagnosis not present

## 2024-11-04 ENCOUNTER — Other Ambulatory Visit: Payer: Self-pay | Admitting: Physical Medicine & Rehabilitation

## 2024-11-05 NOTE — Telephone Encounter (Signed)
 Requested Prescriptions   Pending Prescriptions Disp Refills   gabapentin  (NEURONTIN ) 600 MG tablet [Pharmacy Med Name: GABAPENTIN  600 MG TABLET] 90 tablet 3    Sig: TAKE 1 TABLET BY MOUTH THREE TIMES A DAY     Date of patient request: 11/04/2024 Last office visit: 04/27/2024 Upcoming visit: Visit date not found Date of last refill: 06/27/24 Last refill amount: #90 3 refills  Patient was supposed to follow up in August 2025 and no showed her appt
